# Patient Record
Sex: Female | Born: 1980 | Race: White | Hispanic: No | Marital: Married | Smoking: Former smoker
Health system: Southern US, Community
[De-identification: ages and names within clinical notes are randomized; demographics above are authoritative.]

## PROBLEM LIST (undated history)

## (undated) DIAGNOSIS — B999 Unspecified infectious disease: Secondary | ICD-10-CM

## (undated) DIAGNOSIS — R87629 Unspecified abnormal cytological findings in specimens from vagina: Secondary | ICD-10-CM

## (undated) DIAGNOSIS — Z1612 Extended spectrum beta lactamase (ESBL) resistance: Principal | ICD-10-CM

## (undated) DIAGNOSIS — B9629 Other Escherichia coli [E. coli] as the cause of diseases classified elsewhere: Secondary | ICD-10-CM

## (undated) DIAGNOSIS — N390 Urinary tract infection, site not specified: Principal | ICD-10-CM

## (undated) HISTORY — DX: Other Escherichia coli (E. coli) as the cause of diseases classified elsewhere: B96.29

## (undated) HISTORY — DX: Urinary tract infection, site not specified: N39.0

## (undated) HISTORY — DX: Extended spectrum beta lactamase (ESBL) resistance: Z16.12

---

## 2013-09-18 ENCOUNTER — Ambulatory Visit: Payer: Self-pay

## 2014-02-12 ENCOUNTER — Ambulatory Visit: Payer: Self-pay | Admitting: Family Medicine

## 2015-09-16 ENCOUNTER — Other Ambulatory Visit: Payer: Self-pay | Admitting: Obstetrics and Gynecology

## 2015-09-16 DIAGNOSIS — Z1231 Encounter for screening mammogram for malignant neoplasm of breast: Secondary | ICD-10-CM

## 2015-09-30 ENCOUNTER — Ambulatory Visit
Admission: RE | Admit: 2015-09-30 | Discharge: 2015-09-30 | Disposition: A | Discharge status: Home / Self Care | Payer: BLUE CROSS/BLUE SHIELD | Source: Ambulatory Visit | Attending: Obstetrics and Gynecology | Admitting: Obstetrics and Gynecology

## 2015-09-30 ENCOUNTER — Other Ambulatory Visit: Payer: Self-pay | Admitting: Obstetrics and Gynecology

## 2015-09-30 DIAGNOSIS — Z1231 Encounter for screening mammogram for malignant neoplasm of breast: Secondary | ICD-10-CM | POA: Diagnosis not present

## 2017-12-07 ENCOUNTER — Ambulatory Visit: Payer: BLUE CROSS/BLUE SHIELD | Admitting: Physical Therapy

## 2017-12-27 ENCOUNTER — Encounter: Payer: Self-pay | Admitting: Physical Therapy

## 2017-12-27 ENCOUNTER — Other Ambulatory Visit: Payer: Self-pay

## 2017-12-27 ENCOUNTER — Ambulatory Visit: Payer: BLUE CROSS/BLUE SHIELD | Attending: Obstetrics and Gynecology | Admitting: Physical Therapy

## 2017-12-27 DIAGNOSIS — R278 Other lack of coordination: Secondary | ICD-10-CM | POA: Diagnosis not present

## 2017-12-27 DIAGNOSIS — M62838 Other muscle spasm: Secondary | ICD-10-CM | POA: Insufficient documentation

## 2017-12-27 DIAGNOSIS — R293 Abnormal posture: Secondary | ICD-10-CM | POA: Diagnosis present

## 2017-12-27 NOTE — Patient Instructions (Addendum)
Increase water with one room temp glass in the morning before soda   Complete 20 fl oz of water per day and then progress to 2 servings .    ____  Out of office chair every hour with stretches  Prop R foot on stable chair, rocking back and forth,  L arm on R thigh ( gentle twist)  R arm on R thigh and  shoulder rolls     6 directions of the spine :   sidebend by the wall  arm swings without moving hips  minisquat , with arms back , interlace hands, pull chest lifts up  ( bare foot)   Minisquat: Scoot buttocks back slight, hinge like you are looking at your reflection on a pond  Knees behind toes,  Inhale to "smell flowers"  Exhale on the rise "like rocket"  Do not lock knees, have more weight across ballmounds of feet, toes relaxed   __  Incorporate 15 min walk 2 x worksday   Or 2 min standing marching ( improve blood flow to the legs)   ___  ( handout of office ergonomic ( to minimize rounded shoulders for better circulation in the arms)

## 2017-12-28 NOTE — Therapy (Addendum)
[place] [facility] MAIN [facility] SERVICES [address] [place], [place], [address] Phone: [phone]   Fax:  [phone]  Physical Therapy Evaluation  Patient Details  Name: Mariah [name] MRN: [id number] Date of Birth: 1980/07/25 Referring Provider (PT): Christeen Douglas    Encounter Date: 12/27/2017    Past Medical History:  Diagnosis Date  . Chronic headaches     No past surgical history on file.  There were no vitals filed for this visit.   Subjective Assessment - 12/28/17 2359    Subjective 1) Pelvic pain: Pt has had pelvic pain during pelvic exams for many years with the spectulum and also since 4 months ago when she had sexual intercourse. Pt has not had sex since she was 20 which was painful. Some spotting but no major afterwards. 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 Pt used to go the gym 2x week for 1-2 years and did circuits which included some weight lifting, machines, elliptical, treadmill, core exercises, sit-ups, planks, push ups.  Pt is not exercising now due to busy schedule.    2)  constant LBP across the past 10 years  ( pt points at sacrum). It has flared up and calmed over the years. 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 Pt went to 1-2 PT sessions because she found chiropractic treatments to resolve her pain temporarily.  At worst, 10 /10 with radiating pain behind knee most of the time on RLE, sometimes LLE.  At its best, 1/10 pain.  Currently, pain 3/10 with radiating.   3) Recurrent UTIs in November 2018, August 2018 and a yeast infection last week. Pt wipes front to back, pee and wash after intercourse.  Daily water intake " no much",  12 fl oz of Mountain Dew and 20 oz water.          4) Tingling in both feet (soles)  and arms and hands ( palm) upon waking which occurs randomly since beginning of this year.       Patient Stated Goals  decrease pain and fertility         [facility] PT Assessment - 01/04/18 1640      Assessment    Medical Diagnosis  Pelvic Pain    Referring Provider (PT)  Christeen Douglas       Precautions   Precautions  None      Restrictions   Weight Bearing Restrictions  No      Balance Screen   Has the patient fallen in the past 6 months  No      Observation/Other Assessments   Observations  ankles crossed      Coordination   Gross Motor Movements are Fluid and Coordinated  --   abdominal overuse w/pelvic floor cue( lengthening/contracti     Posture/Postural Control   Posture Comments  limited diaphragmatic excursion       Strength   Overall Strength Comments  BLE gross: 3+/4 B       Palpation   Spinal mobility  hypomobility of thoracic region                Objective measurements completed on examination: See above findings.    Pelvic Floor Special Questions - 01/04/18 1640    Diastasis Recti  neg     External Perineal Exam  no tenderness nor tightness at pelvic floor mm          Treatment:   See pt instructions  PT Long Term Goals - 01/04/18 1628      PT LONG TERM GOAL #1   Title  Pt will demo no tenderness/ tensions of pelvic floor and demo proepr ROM in order to minimize pelvic pain to resume ADLs    Time  6    Period  Weeks    Status  New    Target Date  02/15/18      PT LONG TERM GOAL #2   Title  Pt will demo increased spinal/pelvic mobility and IND with work stretches and compliance to ergonomic workstation recommendations to promote proper circulation to pelvic area and counteract negative health effects of sedentary work week      Time  4    Period  Weeks    Status  New    Target Date  02/01/18      PT LONG TERM GOAL #3   Title  Pt will demo proper sitting posture without cues in order to minimize straining pelvic floor     Time  2    Period  Weeks    Status  New    Target Date  01/18/18      PT LONG TERM GOAL #4   Title  Pt will report decreased numbness/tingling in soles and palms upon waking across 5 days     Time  4     Period  Weeks    Status  New    Target Date  02/01/18      PT LONG TERM GOAL #5   Title  Pt will decrease her back pain by 50% across 1 week in order to perform weighted activities     Time  10    Period  Weeks    Status  New    Target Date  03/15/18             Plan - 12/28/17 2355    Clinical Impression Statement  Pt is a 37 yo who reports pelvic pain, CLBP, and recurrent UTIs, and tingling in both soles of feet, arms and palm upon waking which impact her QOL and ADLs. These deficits impact her QOL and ADLs. Pt's presentation includes limited spinal/pelvic mobility, hip weakness, dyscoordination of pelvic floor/ deep core, poor posture. After today's session, pt demo'd IND with HEP to promote spinal mobility and proper posture, promote more circulation during sedentary work duties.  Provided education on increasing water intake gradually to minimize recurrent UTI. Plan to further assess pelvic floor at next session.      Clinical Presentation  Evolving    Clinical Decision Making  Moderate    Rehab Potential  Good    PT Frequency  1x / week    PT Duration  12 weeks    PT Treatment/Interventions  Therapeutic activities;Functional mobility training;Neuromuscular re-education;Therapeutic exercise;Gait training;Moist Heat;Stair training;Patient/family education;Manual techniques;Scar mobilization;Electrical Stimulation;Manual lymph drainage;Aquatic Therapy    Consulted and Agree with Plan of Care  Patient       Patient will benefit from skilled therapeutic intervention in order to improve the following deficits and impairments:  Hypomobility, Improper body mechanics, Decreased strength, Decreased safety awareness, Increased muscle spasms, Decreased mobility, Decreased endurance, Decreased balance, Decreased range of motion, Decreased coordination, Postural dysfunction, Pain  Visit Diagnosis: Other lack of coordination  Abnormal posture  Other muscle spasm     Problem  List There are no active problems to display for this patient.   Mariane Masters ,PT, DPT, E-RYT  01/04/2018, 4:41 PM  [facility]  [facility] MA[facility] SERVICES [address] [place], [place], [address] Phone: [phone]   Fax:  [phone]  Name: CROSBY BEVAN MRN: [id number] Date of Birth: [birth year]

## 2018-01-04 ENCOUNTER — Ambulatory Visit: Payer: BLUE CROSS/BLUE SHIELD | Admitting: Physical Therapy

## 2018-01-04 DIAGNOSIS — R278 Other lack of coordination: Secondary | ICD-10-CM | POA: Diagnosis not present

## 2018-01-04 NOTE — Addendum Note (Signed)
Addended by: Mariane MastersYEUNG, SHIN-YIING on: 01/04/2018 04:51 PM   Modules accepted: Orders

## 2018-01-04 NOTE — Patient Instructions (Addendum)
Pelvic floor stretches: morning and night / after work     1)  Happy Baby pose  5 breaths       2) Stretch for pelvic floor   "v heels slide away and then back toward buttocks and then rock knee to slight ,  slide heel along at 11 o clock away from buttocks   10 reps         3)  Gentle pelvic tilts   ______ Principle of points of contact    1) when sitting ( feet on ground, hip width apart), sitting bones on seat,    Functional activities: 2) All fours ( table top position) position with toes tucked   3) Pillow under hips when laying on ground    _______ Relaxation practice upon returning home from work  Legs propped on wall / over pillows, knees apart or parallel  10 min  Breathing practice to wind down  Body scan ( emailed)

## 2018-01-04 NOTE — Therapy (Signed)
[place] [facility] MAIN [facility] SERVICES [address] [place], [place], [address] Phone: [phone]   Fax:  [phone]  Physical Therapy Treatment  Patient Details  Name: Mariah [name] MRN: [id number] Date of Birth: 09-29-1980 Referring Provider (PT): Christeen Douglas    Encounter Date: 01/04/2018  PT End of Session - 01/04/18 1609    Visit Number  2    Number of Visits  12    PT Start Time  0805    PT Stop Time  0900    PT Time Calculation (min)  55 min    Activity Tolerance  Patient tolerated treatment well    Behavior During Therapy  [facility] for tasks assessed/performed       Past Medical History:  Diagnosis Date  . Chronic headaches     No past surgical history on file.  There were no vitals filed for this visit.  Subjective Assessment - 01/04/18 0818    Subjective  Pt reported she stretched at work and went to a yoga class. Pt started drinking water. Pt reports her urine smells normal but she notices burning with urination in the top pelvic floor muscles.          [facility] PT Assessment - 01/04/18 1609      Observation/Other Assessments   Observations  ankles crossed       Palpation   Spinal mobility  increased thoracic mobility                 Pelvic Floor Special Questions - 01/04/18 0921    External Palpation  increased tightenss along bulbospongious/ ischiocavernosus      Pelvic Floor Internal Exam  pt consented verbally without contraindications    Exam Type  Vaginal    Palpation  increased tenderness/ tensions on L urethra compressae, bulbospongious, , B coccyegus, iliococcygeus, obt int  L > R          [facility] Adult PT Treatment/Exercise - 01/04/18 1602      Neuro Re-ed    Neuro Re-ed Details   see pt instructions, biopsychosocial education to pelvic pain, applications to stretches to retrain brain on pain      Manual Therapy   Manual therapy comments  superior glides over mons pubis, bulbospongiosus /  ischiocavernosus B     Internal Pelvic Floor  STM/MWM coordinated breathing at problem areas noted assessment                          Plan - 01/04/18 1610    Clinical Impression Statement  Pt showed increased pelvic and spinal mobility with compliance to stretches. Pt showed increased pelvic floor tightness which decreased with internal/ external manual Tx with report of decreased tenderness. Pt was provided biopyschosocial approaches, neuroscience of pain. Pt is limited to low number of visits due to insurance and finances. Plan to address yoga modifications to optimize pelvic floor lengthening and lower kinetic chain co-activation. Pt continues to benefit from skilled PT.      Rehab Potential  Good    PT Frequency  1x / week    PT Duration  12 weeks    PT Treatment/Interventions  Therapeutic activities;Functional mobility training;Neuromuscular re-education;Therapeutic exercise;Gait training;Moist Heat;Stair training;Patient/family education;Manual techniques;Scar mobilization;Electrical Stimulation;Manual lymph drainage;Aquatic Therapy    Consulted and Agree with Plan of Care  Patient       Patient will benefit from skilled therapeutic intervention in order to improve the following deficits and impairments:  Hypomobility, Improper body mechanics, Decreased strength, Decreased safety awareness, Increased muscle spasms, Decreased mobility, Decreased endurance, Decreased balance, Decreased range of motion, Decreased coordination, Postural dysfunction, Pain  Visit Diagnosis: Other lack of coordination     Problem List There are no active problems to display for this patient.   Mariane MastersYeung,Shin Yiing ,PT, DPT, E-RYT  01/04/2018, 4:25 PM  [facility] [address] [place], [place], [address] Phone: [phone]   Fax:  [phone]  Name: Albertina ParrHeather K Bhullar MRN: [id number] Date of Birth: 21-Sep-1980

## 2018-01-11 ENCOUNTER — Encounter: Payer: BLUE CROSS/BLUE SHIELD | Admitting: Physical Therapy

## 2018-01-18 ENCOUNTER — Ambulatory Visit: Payer: BLUE CROSS/BLUE SHIELD | Admitting: Physical Therapy

## 2018-01-18 DIAGNOSIS — M62838 Other muscle spasm: Secondary | ICD-10-CM

## 2018-01-18 DIAGNOSIS — R293 Abnormal posture: Secondary | ICD-10-CM

## 2018-01-18 DIAGNOSIS — R278 Other lack of coordination: Secondary | ICD-10-CM | POA: Diagnosis not present

## 2018-01-18 NOTE — Patient Instructions (Addendum)
Start sleeping on back , pillow low with chin neutral, shoulder blades flat, palm open up   If roll on side, keep pillow between knees and make sure your shoulders are not rounded  And make sure your head is aligned with sacrum ( Avoid fetal position ) to make sure your have blood flow through your brachial [plexus to minimize numbness in your hands upon waking   _____   [employer] 45 Degrees this week on L only ( R sidelying)  10 reps on wrist slightly ahead of shoulder, shoulders blades down and back  10 reps on 45 deg placement of forearm, shoulders blades down and back    .  Complimentary stretch: Cross _ foot over _ thigh, opposite knee straight  3 breaths   Cross thigh over thigh, exhale to hug the thighs in with arms pulling back of thigh, shoulders/ head is relaxed down , Use towel behind thigh is needed.  10 reps  _____  Neck stretches at night/ morning on bed: 1) 6 directions of neck with shoulder blades down and back 5 reps each 2)Angel wings up and bat wings down 10 reps 3)  Forearm, wrists, and finger stretches:  Elbows straight,thumbs down, cross wrists over pull away to feel the middle of shoulder blades stretch ( wings pulling apart from each other) Then squeeze shoulder blades together and down, Bring knuckles towards your chest and end with them under chin Reverse 5 reps And switch to the other side  4) shoulder squeezes down and back with chin tuck  Count aloud 5 sec x 1 0 reps to retrain where the neck needs to be ( in line with sacrum)     Neck stretches at work: midmorning/ mid afternoon 1) Backward elbow circles 10 reps  2) Angel wings up and bat wings down 10 reps 3) Forearm, wrists, and finger stretches:  Elbows straight,thumbs down, cross wrists over pull away to feel the middle of shoulder blades stretch ( wings pulling apart from each other) Then squeeze shoulder blades together and down, Bring knuckles towards your chest and end with them under  chin  Reverse 5 reps And switch to the other side  4) shoulder squeezes down and back with chin tuck  Count aloud 5 sec x 1 0 reps to retrain where the neck needs to be ( in line with sacrum)    ______  bodyscan ( emailed audio) for mindfulness and pain science therapeutic    _____   Avoid straining pelvic floor, abdominal muscles , spine  Use log rolling technique instead of getting out of bed with your neck or the sit-up   Log rolling into and out of .bed  With sidelying position first with shoulder stacked

## 2018-01-18 NOTE — Therapy (Signed)
[facility] [address] [place], [place], [address] Phone: [phone]   Fax:  [phone]  Physical Therapy Treatment  Patient Details  Name: Mariah [name] MRN: [id number] Date of Birth: 1981/02/19 Referring Provider (PT): Christeen DouglasBethany Beasley    Encounter Date: 01/18/2018  PT End of Session - 01/18/18 0854    Visit Number  3    Number of Visits  12    PT Start Time  0800    PT Stop Time  0859    PT Time Calculation (min)  59 min    Activity Tolerance  Patient tolerated treatment well    Behavior During Therapy  [facility] for tasks assessed/performed       Past Medical History:  Diagnosis Date  . Chronic headaches     No past surgical history on file.  There were no vitals filed for this visit.  Subjective Assessment - 01/18/18 0810    Subjective  Pt reports she no longer has burning with urination. Pt also has not had the LBP she had went she started PT but yesterday, pt woke with L hip pain. Pt thinks she twisted wrong. Currently it hurt in the L glut with trunk rotation B ( L > R) and sidebend. Pt does not sleep with a pillow between her knees          [facility] PT Assessment - 01/18/18 0853      AROM   Overall AROM Comments  limited trunk rotation and sidebend ~20% B       Strength   Overall Strength Comments  hip abd L 3/5       Palpation   SI assessment   L PSIS more posterior,  hip ext standing with lumbar compensation  ( improved post Tx)     Palpation comment  upper trap/levator/midtrap tightness B ( decreased post Tx)                    [facility] Adult PT Treatment/Exercise - 01/18/18 0825      Neuro Re-ed    Neuro Re-ed Details   see pt instructions, biopsychosocial education to pelvic pain, applications to stretches to retrain brain on pain      Manual Therapy   Manual therapy comments  long axis distraction, rotation mob to mobilize L SIJ . distraction at upper cervical, inferior mob at  scap spine with sidebend B                   PT Long Term Goals - 01/04/18 1628      PT LONG TERM GOAL #1   Title  Pt will demo no tenderness/ tensions of pelvic floor and demo proepr ROM in order to minimize pelvic pain to resume ADLs    Time  6    Period  Weeks    Status  New    Target Date  02/15/18      PT LONG TERM GOAL #2   Title  Pt will demo increased spinal/pelvic mobility and IND with work stretches and compliance to ergonomic workstation recommendations to promote proper circulation to pelvic area and counteract negative health effects of sedentary work week      Time  4    Period  Weeks    Status  New    Target Date  02/01/18      PT LONG TERM GOAL #3   Title  Pt will demo proper sitting posture without cues  in order to minimize straining pelvic floor     Time  2    Period  Weeks    Status  New    Target Date  01/18/18      PT LONG TERM GOAL #4   Title  Pt will report decreased numbness/tingling in soles and palms upon waking across 5 days     Time  4    Period  Weeks    Status  New    Target Date  02/01/18      PT LONG TERM GOAL #5   Title  Pt will decrease her back pain by 50% across 1 week in order to perform weighted activities     Time  10    Period  Weeks    Status  New    Target Date  03/15/18            Plan - 01/18/18 0908    Clinical Impression Statement  Pt is making progress with report of no more burning with urination and no more LBP.  Today, addressed L hip pain which started upon waking yesterday but through asssessment, this acute pain is likely related to her preferred sleeping position in a fetal position. Pt also reports numbness in her hands upon waking which is likely due to the compression of her brachial plexus from the fetal position.  Educated on sleeping on back and if on the side (place pillow between knees).  Also educated and trained propiocetion of neck and shoulders. Manual Tx decreased pelvic obliquities and  shoulder / neck tightness. Initiated scapular stabilization strengthening for improved upright posture. Pt continues to benefit from skilled PT. and plan to address pelvic floor further at next session.     Rehab Potential  Good    PT Frequency  1x / week    PT Duration  12 weeks    PT Treatment/Interventions  Therapeutic activities;Functional mobility training;Neuromuscular re-education;Therapeutic exercise;Gait training;Moist Heat;Stair training;Patient/family education;Manual techniques;Scar mobilization;Electrical Stimulation;Manual lymph drainage;Aquatic Therapy    Consulted and Agree with Plan of Care  Patient       Patient will benefit from skilled therapeutic intervention in order to improve the following deficits and impairments:  Hypomobility, Improper body mechanics, Decreased strength, Decreased safety awareness, Increased muscle spasms, Decreased mobility, Decreased endurance, Decreased balance, Decreased range of motion, Decreased coordination, Postural dysfunction, Pain  Visit Diagnosis: Other lack of coordination  Abnormal posture  Other muscle spasm     Problem List There are no active problems to display for this patient.   Mariane Masters ,PT, DPT, E-RYT  01/18/2018, 9:43 AM  [place] [facility] MAIN [facility] SERVICES [address] [place], [place], [address] Phone: [phone]   Fax:  [phone]  Name: Mariah [name] MRN: [id number] Date of Birth: March 02, 1980

## 2018-01-25 ENCOUNTER — Encounter: Payer: BLUE CROSS/BLUE SHIELD | Admitting: Physical Therapy

## 2018-02-01 ENCOUNTER — Ambulatory Visit: Payer: BLUE CROSS/BLUE SHIELD | Attending: Obstetrics and Gynecology | Admitting: Physical Therapy

## 2018-02-01 DIAGNOSIS — R278 Other lack of coordination: Secondary | ICD-10-CM | POA: Insufficient documentation

## 2018-02-01 DIAGNOSIS — R293 Abnormal posture: Secondary | ICD-10-CM | POA: Diagnosis present

## 2018-02-01 DIAGNOSIS — M62838 Other muscle spasm: Secondary | ICD-10-CM | POA: Diagnosis present

## 2018-02-01 NOTE — Therapy (Signed)
[place] [facility] MAIN [facility] SERVICES [address] [place], [place], [address] Phone: [phone]   Fax:  [phone]  Physical Therapy Treatment  Patient Details  Name: Mariah [name] MRN: [id number] Date of Birth: [birth year] Referring Provider (PT): Christeen Douglas    Encounter Date: 02/01/2018  PT End of Session - 02/01/18 0906    Visit Number  4    Number of Visits  12    PT Start Time  0810    PT Stop Time  0902    PT Time Calculation (min)  52 min    Activity Tolerance  Patient tolerated treatment well    Behavior During Therapy  [facility] for tasks assessed/performed       Past Medical History:  Diagnosis Date  . Chronic headaches     No past surgical history on file.  There were no vitals filed for this visit.  Subjective Assessment - 02/01/18 0818    Subjective  The L glut pain got better. Low back hurt when she sneezes. Pt still experiences burning with sexual intercourse.  Pt states she is [redacted] weeks pregnant          [facility] PT Assessment - 02/01/18 0835      Observation/Other Assessments   Observations  knee aligned with hips, L toes turned in ( neuro-reedu applied during internal pelvic floor, pnoted increased tightness at L anterior triangle of pelvic floor with toes turned in, no tensions/tightness with proper toe alignment)                 Pelvic Floor Special Questions - 02/01/18 0820    Pelvic Floor Internal Exam  pt consented verbally without contraindications    Exam Type  Vaginal    Palpation  increased tenderness/ tensionsL at proximal attachment of bulbospongiosus, ischiocavernosus L         [facility] Adult PT Treatment/Exercise - 02/01/18 0901      Therapeutic Activites    Therapeutic Activities  --   education about avoiding twists/ strenuous activity w. pregn     Neuro Re-ed    Neuro Re-ed Details   see pt instructions, biopsychosocial education to pelvic pain, applications to stretches to retrain  brain on pain      Manual Therapy   Manual therapy comments  AP mob at midfoot joints to promote dorsiflexion B    external: STM/MWM at anterior pelvic floor mm noted in asses                 PT Long Term Goals - 01/04/18 1628      PT LONG TERM GOAL #1   Title  Pt will demo no tenderness/ tensions of pelvic floor and demo proepr ROM in order to minimize pelvic pain to resume ADLs    Time  6    Period  Weeks    Status  New    Target Date  02/15/18      PT LONG TERM GOAL #2   Title  Pt will demo increased spinal/pelvic mobility and IND with work stretches and compliance to ergonomic workstation recommendations to promote proper circulation to pelvic area and counteract negative health effects of sedentary work week      Time  4    Period  Weeks    Status  New    Target Date  02/01/18      PT LONG TERM GOAL #3   Title  Pt will demo proper sitting posture without cues in  order to minimize straining pelvic floor     Time  2    Period  Weeks    Status  New    Target Date  01/18/18      PT LONG TERM GOAL #4   Title  Pt will report decreased numbness/tingling in soles and palms upon waking across 5 days     Time  4    Period  Weeks    Status  New    Target Date  02/01/18      PT LONG TERM GOAL #5   Title  Pt will decrease her back pain by 50% across 1 week in order to perform weighted activities     Time  10    Period  Weeks    Status  New    Target Date  03/15/18            Plan - 02/01/18 0906    Clinical Impression Statement  Pt showed signficantly less pelvic floor tightness. Residual tightness was addressed with manual Tx and neuro-muscular re-education with lower kinetic chain and stability principles. Addressed pt's high arches and limited DF AROM with manual Tx to promote more co-activation of arches in upright posture.  Addressed retraining of minimizing adduction of L foot to minimize overactivity of L pelvic floor mm. Pt continues to benefit from  skilled PT.     Rehab Potential  Good    PT Frequency  1x / week    PT Duration  12 weeks    PT Treatment/Interventions  Therapeutic activities;Functional mobility training;Neuromuscular re-education;Therapeutic exercise;Gait training;Moist Heat;Stair training;Patient/family education;Manual techniques;Scar mobilization;Electrical Stimulation;Manual lymph drainage;Aquatic Therapy    Consulted and Agree with Plan of Care  Patient       Patient will benefit from skilled therapeutic intervention in order to improve the following deficits and impairments:  Hypomobility, Improper body mechanics, Decreased strength, Decreased safety awareness, Increased muscle spasms, Decreased mobility, Decreased endurance, Decreased balance, Decreased range of motion, Decreased coordination, Postural dysfunction, Pain  Visit Diagnosis: Other lack of coordination  Abnormal posture  Other muscle spasm     Problem List There are no active problems to display for this patient.   Mariane MastersYeung,Shin Yiing ,PT, DPT, E-RYT  02/01/2018, 9:09 AM  [facility] MAIN [facility] [address] [place], [place], [address] Phone: [phone]   Fax:  [phone]  Name: Mariah [name] MRN: [id number] Date of Birth: 1980/06/13

## 2018-02-01 NOTE — Patient Instructions (Addendum)
Focus on 4 corners of feet during deep core level 2   Also with work stretch and exercise  ____ Seated: Notice when toes turn in, keep straight to relax L pelvic floor Notice points of contact into sitting bones and feet, to relax pelvic floor   Foot glides back with heel down for more dorsiflexion  ____ Standing, more center of mass at the ballmounds line, soft knees, arch lifts naturally   ____ Morning and night,  ankle pumps, spread toes,   _____  Pelvic squeeze with coughing and sneezing, inhale to relax again

## 2018-02-08 ENCOUNTER — Ambulatory Visit: Payer: BLUE CROSS/BLUE SHIELD | Admitting: Physical Therapy

## 2018-03-10 ENCOUNTER — Ambulatory Visit: Payer: BLUE CROSS/BLUE SHIELD | Admitting: Physical Therapy

## 2018-03-13 ENCOUNTER — Other Ambulatory Visit: Payer: Self-pay | Admitting: Obstetrics and Gynecology

## 2018-03-13 DIAGNOSIS — Z369 Encounter for antenatal screening, unspecified: Secondary | ICD-10-CM

## 2018-03-23 ENCOUNTER — Ambulatory Visit: Payer: BLUE CROSS/BLUE SHIELD | Attending: Infectious Diseases | Admitting: Infectious Diseases

## 2018-03-23 ENCOUNTER — Encounter: Payer: Self-pay | Admitting: Infectious Diseases

## 2018-03-23 ENCOUNTER — Ambulatory Visit
Admission: RE | Admit: 2018-03-23 | Discharge: 2018-03-23 | Disposition: A | Discharge status: Home / Self Care | Payer: BLUE CROSS/BLUE SHIELD | Source: Ambulatory Visit | Attending: Maternal & Fetal Medicine | Admitting: Maternal & Fetal Medicine

## 2018-03-23 ENCOUNTER — Ambulatory Visit (HOSPITAL_BASED_OUTPATIENT_CLINIC_OR_DEPARTMENT_OTHER)
Admission: RE | Admit: 2018-03-23 | Discharge: 2018-03-23 | Disposition: A | Discharge status: Home / Self Care | Payer: BLUE CROSS/BLUE SHIELD | Source: Ambulatory Visit | Attending: Maternal & Fetal Medicine | Admitting: Maternal & Fetal Medicine

## 2018-03-23 ENCOUNTER — Other Ambulatory Visit: Payer: Self-pay | Admitting: Infectious Diseases

## 2018-03-23 ENCOUNTER — Telehealth: Payer: Self-pay | Admitting: Infectious Diseases

## 2018-03-23 VITALS — BP 126/80 | HR 75 | Temp 97.7°F | Wt 175.0 lb

## 2018-03-23 VITALS — BP 111/75 | HR 107 | Temp 97.6°F | Ht 70.0 in | Wt 175.0 lb

## 2018-03-23 DIAGNOSIS — B9629 Other Escherichia coli [E. coli] as the cause of diseases classified elsewhere: Secondary | ICD-10-CM

## 2018-03-23 DIAGNOSIS — Z3682 Encounter for antenatal screening for nuchal translucency: Secondary | ICD-10-CM | POA: Diagnosis present

## 2018-03-23 DIAGNOSIS — Z1624 Resistance to multiple antibiotics: Secondary | ICD-10-CM

## 2018-03-23 DIAGNOSIS — N390 Urinary tract infection, site not specified: Secondary | ICD-10-CM

## 2018-03-23 DIAGNOSIS — O2341 Unspecified infection of urinary tract in pregnancy, first trimester: Secondary | ICD-10-CM

## 2018-03-23 DIAGNOSIS — Z87891 Personal history of nicotine dependence: Secondary | ICD-10-CM

## 2018-03-23 DIAGNOSIS — Z3A12 12 weeks gestation of pregnancy: Secondary | ICD-10-CM

## 2018-03-23 DIAGNOSIS — N941 Unspecified dyspareunia: Secondary | ICD-10-CM

## 2018-03-23 DIAGNOSIS — N3090 Cystitis, unspecified without hematuria: Secondary | ICD-10-CM

## 2018-03-23 DIAGNOSIS — O2311 Infections of bladder in pregnancy, first trimester: Secondary | ICD-10-CM | POA: Diagnosis not present

## 2018-03-23 DIAGNOSIS — Z87440 Personal history of urinary (tract) infections: Secondary | ICD-10-CM

## 2018-03-23 DIAGNOSIS — Z369 Encounter for antenatal screening, unspecified: Secondary | ICD-10-CM

## 2018-03-23 DIAGNOSIS — B9620 Unspecified Escherichia coli [E. coli] as the cause of diseases classified elsewhere: Secondary | ICD-10-CM | POA: Diagnosis not present

## 2018-03-23 DIAGNOSIS — O09511 Supervision of elderly primigravida, first trimester: Secondary | ICD-10-CM

## 2018-03-23 DIAGNOSIS — Z1612 Extended spectrum beta lactamase (ESBL) resistance: Principal | ICD-10-CM

## 2018-03-23 HISTORY — DX: Other Escherichia coli (E. coli) as the cause of diseases classified elsewhere: B96.29

## 2018-03-23 HISTORY — DX: Urinary tract infection, site not specified: N39.0

## 2018-03-23 HISTORY — DX: Unspecified infectious disease: B99.9

## 2018-03-23 HISTORY — DX: Unspecified abnormal cytological findings in specimens from vagina: R87.629

## 2018-03-23 NOTE — Progress Notes (Signed)
NAME: Mariah [name]  DOB: 02-21-1981  MRN: [id number]  Date/Time: 03/23/2018 9:24 AM  Dalbert Garnet Subjective:  REASON FOR CONSULT: MDR e.coli cystitis ? Mariah [name] is a 38 y.o. female who is [redacted] weeks pregnant is here for UTI with a MDR organism. As per patient  She got married in June 2019  and has been having frequent sex . Prior to that she says was not sexually active for many years. . She says she is very tight and it hurts during and after sex . She is undergoing physical therapy for dyspaurenia and vaginal tightness.  She has been treated for Klebsiella UTi  in Aug 2019 and it was a sensitive bacteria and she was given cipro and her symptoms of burning, frequency resolved . She is now [redacted] weeks pregnant. She had symptoms of frequency, urgency and dysuria 2 days after sex in Jan 2020   and Urine culture (03/01/18) showed  ESBL e.coli. Her obgyn Dr.Beasley spoke to [facility] and was recommended  fosfomycin. Her symptoms resolved and her follow up urine culture was negative on 03/09/18.  Dysuria and frequency recurred  on 03/15/18. Pt was sexually active a day or two before that. Urine culture from 1/23 is again ESBL e.coli and I asked to see her. She says her urine smells, some sacral pain, urgency and frequency. No fever or hematuria Past Medical History:  Diagnosis Date  . Chronic headaches    LGSIL- HPV positive No past surgical history on file.   SH Former smoker Works as an Airline pilot  FH Mother deceased-ALS ? Current Outpatient Medications  Medication Sig Dispense Refill  . cetirizine (ZYRTEC) 10 MG tablet Take 10 mg by mouth daily.    . fluticasone (VERAMYST) 27.5 MCG/SPRAY nasal spray Place 2 sprays into the nose daily.    . montelukast (SINGULAIR) 10 MG tablet Take 10 mg by mouth at bedtime.    . pyridoxine (B-6) 100 MG tablet Take 100 mg by mouth daily.     No current facility-administered medications for this visit.      Abtx:  Anti-infectives (From admission, onward)    None      REVIEW OF SYSTEMS:  Const: negative fever, negative chills, negative weight loss Eyes: negative diplopia or visual changes, negative eye pain ENT: negative coryza, negative sore throat Resp: negative cough, hemoptysis, dyspnea Cards: negative for chest pain, palpitations, lower extremity edema GU: positive for frequency, dysuria ,no  Hematuria, low back pain-near sacrum  GI: Negative for abdominal pain, diarrhea, bleeding, constipation Skin: negative for rash and pruritus Heme: negative for easy bruising and gum/nose bleeding MS: negative for myalgias, arthralgias, back pain and muscle weakness Neurolo:negative for headaches, dizziness, vertigo, memory problems  Psych: negative for feelings of anxiety, depression  Endocrine: negative for thyroid, diabetes Allergy/Immunology- negative for any medication or food allergies ? Pertinent Positives include : Objective:  VITALS:  BP 111/75 (BP Location: Left Arm, Patient Position: Sitting, Cuff Size: Normal)   Pulse (!) 107   Temp 97.6 F (36.4 C) (Oral)   Ht 5\' 10"  (1.778 m)   Wt 175 lb (79.4 kg)   BMI 25.11 kg/m  PHYSICAL EXAM:  General: Alert, cooperative, no distress, appears stated age.  Head: Normocephalic, without obvious abnormality, atraumatic. Eyes: Conjunctivae clear, anicteric sclerae. Pupils are equal ENT Nares normal. No drainage or sinus tenderness. Lips, mucosa, and tongue normal. No Thrush Neck: Supple,  Back: No CVA tenderness. Lungs: Clear to auscultation bilaterally. No Wheezing or Rhonchi. No rales.  Heart: Regular rate and rhythm, no murmur, rub or gallop. Abdomen: Soft, non-tender,not distended. Bowel sounds normal. No masses Extremities: atraumatic, no cyanosis. No edema. No clubbing Skin: limited examination-No rashes or lesions. Or bruising Lymph: Cervical, supraclavicular normal. Neurologic: Grossly non-focal Pertinent Labs Lab Results CBC Labs from [facility] reviewed 03/09/18 Chl/GC  neg RPR NR WBC 9.5 Hb 12.6 PLT 227 HIV nR  Microbiology: 09/21/17 Klebsiella  R only to amp and nitrofurantoin 03/01/2018- E.coli ESBL S to Pip/tazo and Imipenem 03/16/18 E.coli same susceptibility  IMAGING RESULTS:none ? Impression/Recommendation ?38 y.o. female who is [redacted] weeks pregnant is here for UTI with a MDR organism.  ESBl e.coli cystitis in patient who is [redacted] weeks pregnant . Treated before with fosfomycin 3 grams 1 dose on 1/10 and clearance of symptoms and microbiological cure   on 1/16 with recurrence on 1/23. Patient has dyspareunia and during /after she has burning and pain. She got married June 2019 and has had a few episodes since then and not before that.  She is behaving like honeymoon cystitis She is at risk for infection  because of friction during vaginal  intercourse and e.coli being a stool pathogen, could be colonizing genital area, and being exposed to cipro in the past she has developed resistance.   ESBL e.coli she has no oral options except fosfomycin which will likely still work as she had microbiological cure after the 1 st dose. Would try again 1 dose fosfomycin to see whether it will clear the infection or  do multidose(3) fosfomycin which has been shown to be more effective in a few non randomized studies ( Evaluation of three dose fosfomycin in the treatment of patients with UTI -BMJ open 2013).  Especially with ESBL e.coli 3 doses may work better . She does not have pyelonephritis Carabapenem Vs fosfomycin in the treatment of ESBL e.coli related complicated UTI (Jchemother 2010)   (no data of 3 dose  in pregnant women and also currently Fosfomycin is only FDA approved as a 1 dose )   Fosfomycin is category B in pregnancy   The other option is IV ertapenem which I would reserve if repeat  fosfomycin does not work or if she develops  pyelonephritis.  . It is important to address the intercourse  related  Vaginal  trauma and cystitis as she is prone to having  more UTI leading to more antibiotics and side effects and also more resistance.Other measures to prevent intercourse related cystitis should be employed including  Lubrication , personal hygiene, frequent micturition, plenty of hydration, probiotic, cranberry supplement etc She is already undergoing physical therapy for dyspareunia. .  Discussed with patient in detail and she is in favor of the oral option. Will discuss with her Ob/Gyn  Dr.Beasley before deciding on the final regimen- sent a  message to her. ? ___________________________________________________

## 2018-03-23 NOTE — Patient Instructions (Addendum)
You have been referred to me for ESBL e.coli UTI- you took fosfomycin 1 dose and had some improvement only to recur. You are pregnant and need to be treated- the question is whether e can do fosfomycin 3 doses or you need IV antibiotic  mostly your problem stems from frictional sex and so called honey moon cystitis- You may ask you perinatologist about the following Using dilators, cranberry supplement, cetaphil wash,probiotic -you are already having physical therapy for dyspareunia I will discuss with your perinatologist once you see her and decide the best antibiotic course

## 2018-03-23 NOTE — Progress Notes (Signed)
Referring Provider:  Christeen DouglasBeasley, Bethany Length of Consultation: 40 minutes  Ms. Mariah [name] was [facility] for genetic counseling because of advanced maternal age.  The patient will be 38 years old at the time of delivery.  This note summarizes the information we discussed.    We explained that the chance of a chromosome abnormality increases with maternal age.  Chromosomes and examples of chromosome problems were reviewed.  Humans typically have 46 chromosomes in each cell, with half passed through each sperm and egg.  Any change in the number or structure of chromosomes can increase the risk of problems in the physical and mental development of a pregnancy.   Based upon age of the patient and the current gestational age, the chance of any chromosome abnormality was 1 in 6629. The chance of Down syndrome, the most common chromosome problem associated with maternal age, was 1 in 2656.  The risk of chromosome problems is in addition to the 3% general population risk for birth defects and mental retardation.  The greatest chance, of course, is that the baby would be born in good health.  We discussed the following prenatal screening and testing options for this pregnancy:  First trimester screening, which includes nuchal translucency ultrasound screen and first trimester maternal serum marker screening.  The nuchal translucency has approximately an 80% detection rate for Down syndrome and can be positive for other chromosome abnormalities as well as heart defects.  When combined with a maternal serum marker screening, the detection rate is up to 90% for Down syndrome and up to 97% for trisomy 18.     The chorionic villus sampling procedure is available for first trimester chromosome analysis.  This involves the withdrawal of a small amount of chorionic villi (tissue from the developing placenta).  Risk of pregnancy loss is estimated to be approximately 1 in 200 to 1 in 100  (0.5 to 1%).  There is approximately a 1% (1 in 100) chance that the CVS chromosome results will be unclear.  Chorionic villi cannot be tested for neural tube defects.     Maternal serum marker screening, a blood test that measures pregnancy proteins, can provide risk assessments for Down syndrome, trisomy 18, and open neural tube defects (spina bifida, anencephaly). Because it does not directly examine the fetus, it cannot positively diagnose or rule out these problems.  Targeted ultrasound uses high frequency sound waves to create an image of the developing fetus.  An ultrasound is often recommended as a routine means of evaluating the pregnancy.  It is also used to screen for fetal anatomy problems (for example, a heart defect) that might be suggestive of a chromosomal or other abnormality.   Amniocentesis involves the removal of a small amount of amniotic fluid from the sac surrounding the fetus with the use of a thin needle inserted through the maternal abdomen and uterus.  Ultrasound guidance is used throughout the procedure.  Fetal cells from amniotic fluid are directly evaluated and > 99.5% of chromosome problems and > 98% of open neural tube defects can be detected. This procedure is generally performed after the 15th week of pregnancy.  The main risks to this procedure include complications leading to miscarriage in less than 1 in 200 cases (0.5%).  We also reviewed the availability of cell free fetal DNA testing from maternal blood to determine whether or not the baby may have either Down syndrome, trisomy 3913, or trisomy 4118.  This test utilizes a maternal  blood sample and DNA sequencing technology to isolate circulating cell free fetal DNA from maternal plasma.  The fetal DNA can then be analyzed for DNA sequences that are derived from the three most common chromosomes involved in aneuploidy, chromosomes 13, 18, and 21.  If the overall amount of DNA is greater than the expected level for any of  these chromosomes, aneuploidy is suspected.  While we do not consider it a replacement for invasive testing and karyotype analysis, a negative result from this testing would be reassuring, though not a guarantee of a normal chromosome complement for the baby.  An abnormal result is certainly suggestive of an abnormal chromosome complement, though we would still recommend CVS or amniocentesis to confirm any findings from this testing.  Cystic Fibrosis and Spinal Muscular Atrophy (SMA) screening were also discussed with the patient. Both conditions are recessive, which means that both parents must be carriers in order to have a child with the disease.  Cystic fibrosis (CF) is one of the most common genetic conditions in persons of Caucasian ancestry.  This condition occurs in approximately 1 in 2,500 Caucasian persons and results in thickened secretions in the lungs, digestive, and reproductive systems.  For a baby to be at risk for having CF, both of the parents must be carriers for this condition.  Approximately 1 in 9125 Caucasian persons is a carrier for CF.  Current carrier testing looks for the most common mutations in the gene for CF and can detect approximately 90% of carriers in the Caucasian population.  This means that the carrier screening can greatly reduce, but cannot eliminate, the chance for an individual to have a child with CF.  If an individual is found to be a carrier for CF, then carrier testing would be available for the partner. As part of [place]'s newborn screening profile, all babies born in the state of [place] will have a two-tier screening process.  Specimens are first tested to determine the concentration of immunoreactive trypsinogen (IRT).  The top 5% of specimens with the highest IRT values then undergo DNA testing using a panel of over 40 common CF mutations. SMA is a neurodegenerative disorder that leads to atrophy of skeletal muscle and overall weakness.  This  condition is also more prevalent in the Caucasian population, with 1 in 40-1 in 60 persons being a carrier and 1 in 6,000-1 in 10,000 children being affected.  There are multiple forms of the disease, with some causing death in infancy to other forms with survival into adulthood.  The genetics of SMA is complex, but carrier screening can detect up to 95% of carriers in the Caucasian population.  Similar to CF, a negative result can greatly reduce, but cannot eliminate, the chance to have a child with SMA.  We obtained a detailed family history and pregnancy history.  Ms. Mariah [name] reported one paternal first cousin once removed who is being evaluated for developmental concerns and found to have polymicrogyria.  This is a term to describe the brain changes seen on imaging.  There are different types of polymicrogyria with varying severity and features.  In addition, it may occur as an isolated finding or as part of several genetic syndromes.  If anything is learned about the underlying cause, we are happy to discuss possible risks to other family members.  She also reported that her mother passed away from ALS.  While this condition may have strong inherited factors in 5-10% of individuals, most cases occur in  one family member with a low risk to other relatives.  Lastly, Ms. Frede had a paternal aunt who passed away from cervical cancer who had a daughter who died at 41 years old from rhabdomyosarcoma. Most cases of cervical cancer are no expected to be due to genetic factors.   Similarly, most cases of rhabdomyosarcoma occur sporadically.  It can be associated with some genetic syndromes, including Neurofibromatosis, but more details about the diagnosis would be needed to determine if other family members would be at increased risk.  Arlys John, the father of the baby, stated that he has a maternal first cousin once removed with a structural heart defect who is healthy and developmentally normal following cardiac  surgery.  Given that this is a fifth degree relative to this pregnancy, we would not expect an increased risk due to this history.   The remainder of the family history is unremarkable for birth defects, developmental delays, recurrent pregnancy loss or known chromosome abnormalities.  Ms. Zunker stated that this is her first pregnancy.  She reported no complications or exposures that would be expected to increase the risk for birth defects.  After consideration of the options, Ms. Donlon elected to proceed with cell free fetal DNA testing.  The declined carrier screening for CF and SMA.    An ultrasound was performed at the time of the visit.  The gestational age was consistent with  12 weeks.  Fetal anatomy could not be assessed due to early gestational age.  Please refer to the ultrasound report for details of that study.  We recommend a detailed anatomy ultrasound at [redacted] weeks gestation and a fetal echocardiogram after [redacted] weeks gestation to due maternal age of 38 years at delivery.  Ms. Strieter was encouraged to call with questions or concerns.  We can be contacted at [phone].   Tests Ordered: MaterniT21 PLUS with [facility]    Cherly Anderson, MS, CGC

## 2018-03-24 ENCOUNTER — Other Ambulatory Visit: Payer: Self-pay | Admitting: Infectious Diseases

## 2018-03-24 MED ORDER — FOSFOMYCIN TROMETHAMINE 3 G PO PACK: 3.0000 g | PACK | Freq: Once | ORAL | 0 refills | Status: AC

## 2018-03-24 NOTE — Progress Notes (Signed)
Called patient and informed her that we will be doing 1 dose fosfomycin and check urine culture in 2 weeks- not to have sex in 2 weeks.

## 2018-03-28 LAB — MATERNIT21 PLUS CORE+SCA
Chromosome 13: NEGATIVE
Chromosome 18: NEGATIVE
Chromosome 21: NEGATIVE
Fetal Fraction: 9
Y Chromosome: NOT DETECTED

## 2018-03-30 ENCOUNTER — Telehealth: Payer: Self-pay | Admitting: Obstetrics and Gynecology

## 2018-03-30 NOTE — Progress Notes (Signed)
Pt seen by me, agree with assessment and plan as outlined in [facility] Wells's note 

## 2018-03-30 NOTE — Telephone Encounter (Signed)
The patient was informed of the results of her recent MaterniT21 testing which yielded NEGATIVE results.  The patient's specimen showed DNA consistent with two copies of chromosomes 21, 18 and 13.  The sensitivity for trisomy 21, trisomy 18 and trisomy 13 using this testing are reported as 99.1%, 99.9% and 91.7% respectively.  Thus, while the results of this testing are highly accurate, they are not considered diagnostic at this time.  Should more definitive information be desired, the patient may still consider amniocentesis.   As requested to know by the patient, sex chromosome analysis was included for this sample.  Results are consistent with a female fetus (NO Y chromosome material detected). This is predicted with >99% accuracy.  A maternal serum AFP only should be considered if screening for neural tube defects is desired.  We may be reached at [phone] with any questions or concerns.   Shirely Toren F. Kayzen Kendzierski, MS, CGC   

## 2018-04-10 ENCOUNTER — Encounter: Payer: BLUE CROSS/BLUE SHIELD | Admitting: Physical Therapy

## 2018-04-17 ENCOUNTER — Ambulatory Visit: Payer: BLUE CROSS/BLUE SHIELD | Attending: Obstetrics and Gynecology | Admitting: Physical Therapy

## 2018-04-17 DIAGNOSIS — R293 Abnormal posture: Secondary | ICD-10-CM | POA: Insufficient documentation

## 2018-04-17 DIAGNOSIS — R278 Other lack of coordination: Secondary | ICD-10-CM | POA: Diagnosis present

## 2018-04-17 DIAGNOSIS — M62838 Other muscle spasm: Secondary | ICD-10-CM | POA: Insufficient documentation

## 2018-04-17 NOTE — Patient Instructions (Addendum)
Two pelvic floor stretches:  "v heels slide away and then back toward buttocks and then rock knee to slight ,  slide heel along at 11 o clock away from buttocks   10 reps  ___  One knee out 45 deg on exhale   10 reps    ___   3 foot ankle exercises   Standing lunges  ( heel and toes straight of the back foot)   10 reps on both    PLEATS: BALLERINA  Heel raises in ballerina position, pressing rolled towel between heels  Hands at a corner of a wall   Knees bent pointed out like a "v" , navel ( center of mass) more forward  Heels together as you lift, pointed out like a "v"  KNEES ARE ALIGNED BEHIND THE TOES TO MINIMIZE STRAIN ON THE KNEES Lower heels down slow with your  navel ( center of mass) more forward to avoid dropping down fast and rocking more weight back onto heels   10 reps  ________   Elba Barman WITH RESISTANCE BLUE Band at waist connected to doorknob Stepping forward normal length steps, planting mid and forefoot down, center of mass ( navel) leans forward slightly as if you were walking uphill 3-4 steps till band feels taut ( MAKE SURE THE DOOR IS LOCKED AND WON'T OPEN)   Stepping backwards, lower heel slowly, carry trunk and hips back as you step Both    ____ Consult your doctor about probiotics for vaginal tract

## 2018-04-18 NOTE — Therapy (Signed)
[place] MAIN [facility] SERVICES [address] [place], [place], [address] Phone: [phone]   Fax:  [phone]  Physical Therapy Treatment [place] [facility] Note  Patient Details  Name: Mariah [name] MRN: [id number] Date of Birth: March 24, 1980 Referring Provider (PT): Benjaman Kindler    Encounter Date: 04/17/2018  PT End of Session - 04/18/18 1905    Visit Number  5    Number of Visits  12    PT Start Time  6962    PT Stop Time  9528    PT Time Calculation (min)  59 min    Activity Tolerance  Patient tolerated treatment well    Behavior During Therapy  [facility] for tasks assessed/performed       Past Medical History:  Diagnosis Date  . Infection    Current UTI January 2020  . Urinary tract infection due to extended-spectrum beta lactamase (ESBL) producing Escherichia coli 03/23/2018  . Vaginal Pap smear, abnormal    HPV    No past surgical history on file.  There were no vitals filed for this visit.  Subjective Assessment - 04/17/18 1712    Subjective  Pt reports pain with intercourse and causing UTIs. In Jan, pt had a UTI and took antibiotics. Two weeks later, pt had UTI again. Pt had to go to the Infectious Disease Department and she took another antibiotic.  Pt is in 16th week of pregnancy.  Pt has been told that her UTIs will keep occuring.  Pt has increased her water intake. Burning with urination occurred after a long period of time with not having intercourse.             [facility] PT Assessment - 04/17/18 1719      Single Leg Stance   Comments  poor eccentric control with ankle instability on descent on B ankles      Ambulation/Gait   Gait Comments  medial collapse on B ankles                Pelvic Floor Special Questions - 04/18/18 1905    External Perineal Exam  increased tightness/ tenderness at B ischiocavernosus         [facility] Adult PT Treatment/Exercise - 04/18/18 1858      Ambulation/Gait   Gait Comments   medial collapse on B ankles      Therapeutic Activites    Therapeutic Activities  Other Therapeutic Activities    Other Therapeutic Activities  Deferred pt to discuss with MD re: probiotics for inestinal/ vaginal tract to minimize UTIs and promote healthy pregnancy      Neuro Re-ed    Neuro Re-ed Details   cued for increased hip /knee flexion, co-activation of transverse arches, longer strides to minimize pronation      Manual Therapy   Manual therapy comments  AP mob at midfoot joints to promote dorsiflexion L , external STM over ischiocavernosus B,     external: STM/MWM at anterior pelvic floor mm noted in asses                 PT Long Term Goals - 04/17/18 1727      PT LONG TERM GOAL #1   Title  Pt will demo no tenderness/ tensions of pelvic floor and demo proepr ROM in order to minimize pelvic pain to resume ADLs    Time  6    Period  Weeks    Status  On-going      PT LONG  TERM GOAL #2   Title  Pt will demo increased spinal/pelvic mobility and IND with work stretches and compliance to ergonomic workstation recommendations to promote proper circulation to pelvic area and counteract negative health effects of sedentary work week      Time  4    Period  Weeks    Status  Achieved      PT LONG TERM GOAL #3   Title  Pt will demo proper sitting posture without cues in order to minimize straining pelvic floor     Time  2    Period  Weeks    Status  Achieved      PT LONG TERM GOAL #4   Title  Pt will report decreased numbness/tingling in soles and palms upon waking across 5 days     Time  4    Period  Weeks    Status  Achieved      PT LONG TERM GOAL #5   Title  Pt will decrease her back pain by 50% across 1 week in order to perform weighted activities     Time  10    Period  Weeks    Status  Not Met      Additional Long Term Goals   Additional Long Term Goals  Yes            Plan - 04/18/18 1905    Clinical Impression Statement  Pt has achieved 3/6  goals and is progress well towards her remaining goals. Pt continued to show increased anterior pelvic floor mm tightness which is likely related to recurrent UTI due to limited pelvic floor lengthen.  Deferred pt to discuss with MD re: probiotics for intestinal/ vaginal tract to minimize UTIs and promote healthy pregnancy .  Addressed pelvic floor overactivity with regional interdependent  approach to minimize relapse by addressing lower kinetic chain which showed limited DF / hip /knee flexion and subtalar inversion /pronation.  Pt showed improved gait after training in addition to increased pelvic floor lengthening. Pt continues to benefit from skilled PT.    Rehab Potential  Good    PT Frequency  1x / week    PT Duration  12 weeks    PT Treatment/Interventions  Therapeutic activities;Functional mobility training;Neuromuscular re-education;Therapeutic exercise;Gait training;Moist Heat;Stair training;Patient/family education;Manual techniques;Scar mobilization;Electrical Stimulation;Manual lymph drainage;Aquatic Therapy    Consulted and Agree with Plan of Care  Patient       Patient will benefit from skilled therapeutic intervention in order to improve the following deficits and impairments:  Hypomobility, Improper body mechanics, Decreased strength, Decreased safety awareness, Increased muscle spasms, Decreased mobility, Decreased endurance, Decreased balance, Decreased range of motion, Decreased coordination, Postural dysfunction, Pain  Visit Diagnosis: Other lack of coordination  Abnormal posture  Other muscle spasm     Problem List Patient Active Problem List   Diagnosis Date Noted  . Urinary tract infection due to extended-spectrum beta lactamase (ESBL) producing Escherichia coli 03/23/2018    Jerl Mina ,PT, DPT, E-RYT  04/18/2018, 7:08 PM  [place] MAIN [facility] SERVICES [address] [place], [place], [address] Phone: [phone]    Fax:  [phone]  Name: KAMORIE ALDOUS MRN: [id number] Date of Birth: [birth year]

## 2018-05-01 ENCOUNTER — Ambulatory Visit: Payer: BLUE CROSS/BLUE SHIELD | Attending: Obstetrics and Gynecology | Admitting: Physical Therapy

## 2018-05-01 DIAGNOSIS — R278 Other lack of coordination: Secondary | ICD-10-CM | POA: Diagnosis present

## 2018-05-01 DIAGNOSIS — M62838 Other muscle spasm: Secondary | ICD-10-CM | POA: Insufficient documentation

## 2018-05-01 DIAGNOSIS — R293 Abnormal posture: Secondary | ICD-10-CM | POA: Insufficient documentation

## 2018-05-01 NOTE — Patient Instructions (Addendum)
[email or web address]  [email or web address] has blog pre/ post pregnancy    Body pillow, Breast feeding pillow  Bellies belly wrap   ____   Folded towel under belly when sidelying   Coordinated clam shells 20 reps each side daily   Keep up with pelvic floor/ LE stretches.  Keep up with deep core level 1 ( breathing for pelvic floor lengthening)    _____ Wear shoes with good sole support

## 2018-05-02 NOTE — Therapy (Signed)
[place] [facility] MAIN [facility] SERVICES [address] [place], [place], [address] Phone: [phone]   Fax:  [phone]  Physical Therapy Treatment  Patient Details  Name: Mariah [name] MRN: [id number] Date of Birth: 08/15/1980 Referring Provider (PT): Christeen Douglas    Encounter Date: 05/01/2018  PT End of Session - 05/01/18 1744    Visit Number  6    Number of Visits  12    Date for PT Re-Evaluation  07/11/18    PT Start Time  1705    PT Stop Time  1726    PT Time Calculation (min)  21 min    Activity Tolerance  Patient tolerated treatment well    Behavior During Therapy  [facility] for tasks assessed/performed       Past Medical History:  Diagnosis Date  . Infection    Current UTI January 2020  . Urinary tract infection due to extended-spectrum beta lactamase (ESBL) producing Escherichia coli 03/23/2018  . Vaginal Pap smear, abnormal    HPV    No past surgical history on file.  There were no vitals filed for this visit.  Subjective Assessment - 05/02/18 2220    Subjective  Pt reports she had less pelvic pain and her UTI has cleared.          [facility] PT Assessment - 05/02/18 2153      Ambulation/Gait   Gait Comments  less medial collapse.of ankle                 Pelvic Floor Special Questions - 05/02/18 2152    External Perineal Exam  no pelvic floor tightness  noted         [facility] Adult PT Treatment/Exercise - 05/02/18 2151      Therapeutic Activites    Other Therapeutic Activities  provided pregnancy education: recommended belly brace, shoe wear, book, blog info                   PT Long Term Goals - 05/02/18 2156      PT LONG TERM GOAL #1   Title  Pt will demo no tenderness/ tensions of pelvic floor and demo proepr ROM in order to minimize pelvic pain to resume ADLs    Time  6    Period  Weeks    Status  Achieved      PT LONG TERM GOAL #2   Title  Pt will demo increased spinal/pelvic mobility and  IND with work stretches and compliance to ergonomic workstation recommendations to promote proper circulation to pelvic area and counteract negative health effects of sedentary work week      Time  4    Period  Weeks    Status  Achieved      PT LONG TERM GOAL #3   Title  Pt will demo proper sitting posture without cues in order to minimize straining pelvic floor     Time  2    Period  Weeks    Status  Achieved      PT LONG TERM GOAL #4   Title  Pt will report decreased numbness/tingling in soles and palms upon waking across 5 days     Time  4    Period  Weeks    Status  Achieved      PT LONG TERM GOAL #5   Title  Pt will report minimal LBP during 2nd trimester in order to perform ADLs     Time  12    Period  Weeks    Status  Revised            Plan - 05/02/18 2154    Clinical Impression Statement Pt returned today with significantly less pelvic floor tightness compared to last week. Pt also showed improved ankle mobility and less medial collapse which will help minimize anterior pelvic floor mm tightness. Pt was educated about pregnancy related preventative measures to minimize LBP, strengthening hips as pt is in her 1st trimester of 1st pregnancy. Pt was provided information about pregnancy belt, blog, and a pre/post partum book. Session was abbreviated as pt demo'd IND with prior exercises for minimizing pelvic floor tightness/ LBP from prior sessions. Pt will continues to benefit from skilled PT if she has relapsed Sx of pelvic floor/ LBP as her pregnancy progresses.    Rehab Potential  Good   .  PT Frequency  1x / week    PT Duration  12 weeks    PT Treatment/Interventions  Therapeutic activities;Functional mobility training;Neuromuscular re-education;Therapeutic exercise;Gait training;Moist Heat;Stair training;Patient/family education;Manual techniques;Scar mobilization;Electrical Stimulation;Manual lymph drainage;Aquatic Therapy    Consulted and Agree with Plan of Care   Patient       Patient will benefit from skilled therapeutic intervention in order to improve the following deficits and impairments:  Hypomobility, Improper body mechanics, Decreased strength, Decreased safety awareness, Increased muscle spasms, Decreased mobility, Decreased endurance, Decreased balance, Decreased range of motion, Decreased coordination, Postural dysfunction, Pain  Visit Diagnosis: Other lack of coordination - Plan: PT plan of care cert/re-cert  Abnormal posture - Plan: PT plan of care cert/re-cert  Other muscle spasm - Plan: PT plan of care cert/re-cert     Problem List Patient Active Problem List   Diagnosis Date Noted  . Urinary tract infection due to extended-spectrum beta lactamase (ESBL) producing Escherichia coli 03/23/2018    Mariane Masters ,PT, DPT, E-RYT  05/02/2018, 10:20 PM  [place] [facility] MAIN [facility] SERVICES [address] [place], [place], [address] Phone: [phone]   Fax:  [phone]  Name: Mariah [name] MRN: [id number] Date of Birth: 1981/01/06

## 2018-05-04 ENCOUNTER — Other Ambulatory Visit: Payer: BLUE CROSS/BLUE SHIELD

## 2018-06-08 ENCOUNTER — Encounter: Payer: BLUE CROSS/BLUE SHIELD | Admitting: Physical Therapy

## 2018-06-22 ENCOUNTER — Encounter: Payer: BLUE CROSS/BLUE SHIELD | Admitting: Physical Therapy

## 2018-09-01 ENCOUNTER — Other Ambulatory Visit: Payer: Self-pay

## 2018-09-01 ENCOUNTER — Inpatient Hospital Stay
Admission: EM | Admit: 2018-09-01 | Discharge: 2018-09-06 | DRG: 832 | Disposition: A | Discharge status: Home / Self Care | Payer: BC Managed Care – PPO | Attending: Obstetrics & Gynecology | Admitting: Obstetrics & Gynecology

## 2018-09-01 DIAGNOSIS — M549 Dorsalgia, unspecified: Secondary | ICD-10-CM | POA: Diagnosis present

## 2018-09-01 DIAGNOSIS — D649 Anemia, unspecified: Secondary | ICD-10-CM | POA: Diagnosis present

## 2018-09-01 DIAGNOSIS — R05 Cough: Secondary | ICD-10-CM

## 2018-09-01 DIAGNOSIS — R109 Unspecified abdominal pain: Secondary | ICD-10-CM

## 2018-09-01 DIAGNOSIS — O2343 Unspecified infection of urinary tract in pregnancy, third trimester: Principal | ICD-10-CM | POA: Diagnosis present

## 2018-09-01 DIAGNOSIS — Z87891 Personal history of nicotine dependence: Secondary | ICD-10-CM

## 2018-09-01 DIAGNOSIS — B9620 Unspecified Escherichia coli [E. coli] as the cause of diseases classified elsewhere: Secondary | ICD-10-CM | POA: Diagnosis present

## 2018-09-01 DIAGNOSIS — O99013 Anemia complicating pregnancy, third trimester: Secondary | ICD-10-CM | POA: Diagnosis present

## 2018-09-01 DIAGNOSIS — O99891 Other specified diseases and conditions complicating pregnancy: Secondary | ICD-10-CM

## 2018-09-01 DIAGNOSIS — R6510 Systemic inflammatory response syndrome (SIRS) of non-infectious origin without acute organ dysfunction: Secondary | ICD-10-CM

## 2018-09-01 DIAGNOSIS — O99113 Other diseases of the blood and blood-forming organs and certain disorders involving the immune mechanism complicating pregnancy, third trimester: Secondary | ICD-10-CM | POA: Diagnosis present

## 2018-09-01 DIAGNOSIS — Z20828 Contact with and (suspected) exposure to other viral communicable diseases: Secondary | ICD-10-CM | POA: Diagnosis present

## 2018-09-01 DIAGNOSIS — R030 Elevated blood-pressure reading, without diagnosis of hypertension: Secondary | ICD-10-CM | POA: Diagnosis present

## 2018-09-01 DIAGNOSIS — R10A1 Flank pain, right side: Secondary | ICD-10-CM

## 2018-09-01 DIAGNOSIS — O26893 Other specified pregnancy related conditions, third trimester: Secondary | ICD-10-CM | POA: Diagnosis present

## 2018-09-01 DIAGNOSIS — A4151 Sepsis due to Escherichia coli [E. coli]: Secondary | ICD-10-CM | POA: Diagnosis present

## 2018-09-01 DIAGNOSIS — Z3A35 35 weeks gestation of pregnancy: Secondary | ICD-10-CM

## 2018-09-01 DIAGNOSIS — R059 Cough, unspecified: Secondary | ICD-10-CM

## 2018-09-01 DIAGNOSIS — R7881 Bacteremia: Secondary | ICD-10-CM

## 2018-09-01 DIAGNOSIS — D696 Thrombocytopenia, unspecified: Secondary | ICD-10-CM | POA: Diagnosis present

## 2018-09-01 LAB — COMPREHENSIVE METABOLIC PANEL
ALT: 19 U/L (ref 0–44)
AST: 21 U/L (ref 15–41)
Albumin: 2.8 g/dL — ABNORMAL LOW (ref 3.5–5.0)
Alkaline Phosphatase: 90 U/L (ref 38–126)
Anion gap: 9 (ref 5–15)
BUN: 9 mg/dL (ref 6–20)
CO2: 17 mmol/L — ABNORMAL LOW (ref 22–32)
Calcium: 7.9 mg/dL — ABNORMAL LOW (ref 8.9–10.3)
Chloride: 108 mmol/L (ref 98–111)
Creatinine, Ser: 0.55 mg/dL (ref 0.44–1.00)
GFR calc Af Amer: >60 mL/min (ref >60)
GFR calc non Af Amer: >60 mL/min (ref >60)
Glucose, Bld: 97 mg/dL (ref 70–99)
Potassium: 3.3 mmol/L — ABNORMAL LOW (ref 3.5–5.1)
Sodium: 134 mmol/L — ABNORMAL LOW (ref 135–145)
Total Bilirubin: 0.8 mg/dL (ref 0.3–1.2)
Total Protein: 6.4 g/dL — ABNORMAL LOW (ref 6.5–8.1)

## 2018-09-01 LAB — CBC
HCT: 33.4 % — ABNORMAL LOW (ref 36.0–46.0)
Hemoglobin: 11 g/dL — ABNORMAL LOW (ref 12.0–15.0)
MCH: 30.8 pg (ref 26.0–34.0)
MCHC: 32.9 g/dL (ref 30.0–36.0)
MCV: 93.6 fL (ref 80.0–100.0)
Platelets: 133 10*3/uL — ABNORMAL LOW (ref 150–400)
RBC: 3.57 MIL/uL — ABNORMAL LOW (ref 3.87–5.11)
RDW: 13.1 % (ref 11.5–15.5)
WBC: 10.6 10*3/uL — ABNORMAL HIGH (ref 4.0–10.5)
nRBC: 0 % (ref 0.0–0.2)

## 2018-09-01 LAB — URINALYSIS, COMPLETE (UACMP) WITH MICROSCOPIC
Bilirubin Urine: NEGATIVE
Glucose, UA: NEGATIVE mg/dL
Ketones, ur: NEGATIVE mg/dL
Nitrite: NEGATIVE
Protein, ur: 100 mg/dL — AB
Specific Gravity, Urine: 1.011 (ref 1.005–1.030)
WBC, UA: >50 WBC/hpf — ABNORMAL HIGH (ref 0–5)
pH: 7 (ref 5.0–8.0)

## 2018-09-01 LAB — TYPE AND SCREEN
ABO/RH(D): O POS
Antibody Screen: NEGATIVE

## 2018-09-01 LAB — PROTEIN / CREATININE RATIO, URINE
Creatinine, Urine: 59 mg/dL
Protein Creatinine Ratio: 1.12 mg/mg{Cre} — ABNORMAL HIGH (ref 0.00–0.15)
Total Protein, Urine: 66 mg/dL

## 2018-09-01 LAB — SARS CORONAVIRUS 2 BY RT PCR (HOSPITAL ORDER, PERFORMED IN ~~LOC~~ HOSPITAL LAB): SARS Coronavirus 2: NEGATIVE

## 2018-09-01 MED ORDER — LACTATED RINGERS IV SOLN
INTRAVENOUS | Status: DC
  Administered 2018-09-01 – 2018-09-05 (×11): via INTRAVENOUS

## 2018-09-01 MED ORDER — LACTATED RINGERS IV BOLUS
1000.0000 mL | Freq: Once | INTRAVENOUS | Status: AC
  Administered 2018-09-01: 1000 mL via INTRAVENOUS

## 2018-09-01 MED ORDER — ACETAMINOPHEN 325 MG PO TABS
650.0000 mg | ORAL_TABLET | ORAL | Status: DC | PRN
  Administered 2018-09-01 – 2018-09-02 (×5): 650 mg via ORAL
  Filled 2018-09-01 (×5): qty 2

## 2018-09-01 MED ORDER — CALCIUM CARBONATE ANTACID 500 MG PO CHEW
2.0000 | CHEWABLE_TABLET | ORAL | Status: DC | PRN
  Administered 2018-09-04: 400 mg via ORAL
  Filled 2018-09-01: qty 2

## 2018-09-01 MED ORDER — ONDANSETRON HCL 4 MG/2ML IJ SOLN
4.0000 mg | Freq: Four times a day (QID) | INTRAMUSCULAR | Status: DC | PRN
  Administered 2018-09-02 – 2018-09-03 (×6): 4 mg via INTRAVENOUS
  Filled 2018-09-01 (×6): qty 2

## 2018-09-01 MED ORDER — SODIUM CHLORIDE 0.9 % IV SOLN
1.0000 g | INTRAVENOUS | Status: DC
  Administered 2018-09-01: 1 g via INTRAVENOUS
  Filled 2018-09-01: qty 1
  Filled 2018-09-01: qty 10

## 2018-09-01 NOTE — Progress Notes (Signed)
Mariah [name] is a 38 y.o. female. She is at [redacted]w[redacted]d gestation. Patient's last menstrual period was 12/26/2017. Estimated Date of Delivery: 10/02/18   S: Resting comfortably. Feeling hungry.    Maternal Medical History:   Past Medical History:  Diagnosis Date  . Infection    Current UTI January 2020  . Urinary tract infection due to extended-spectrum beta lactamase (ESBL) producing Escherichia coli 03/23/2018  . Vaginal Pap smear, abnormal    HPV    History reviewed. No pertinent surgical history.  No Known Allergies  Prior to Admission medications   Medication Sig Start Date End Date Taking? Authorizing Provider  cetirizine (ZYRTEC) 10 MG tablet Take 10 mg by mouth daily.   Yes [provider]  fluticasone (VERAMYST) 27.5 MCG/SPRAY nasal spray Place 2 sprays into the nose daily.   Yes [provider]  montelukast (SINGULAIR) 10 MG tablet Take 10 mg by mouth at bedtime.   Yes [provider]  Prenatal Vit-Fe Fumarate-FA (PRENATAL MULTIVITAMIN) TABS tablet Take 1 tablet by mouth daily at 12 noon.   Yes [provider]  pyridoxine (B-6) 100 MG tablet Take 100 mg by mouth daily.   Yes [provider]     Social History: She  reports that she has quit smoking. Her smoking use included cigarettes. She started smoking about 6 years ago. She has a 9.50 pack-year smoking history. She has never used smokeless tobacco. She reports previous alcohol use. She reports that she does not use drugs.  Family History: family history includes ALS in her mother; Arthritis in her maternal grandmother; Asthma in her paternal grandfather; Cancer in her paternal aunt; Emphysema in her father; Glaucoma in her paternal grandfather; Heart disease in her maternal grandfather; Lumbar disc disease in her father; Osteopenia in her paternal grandmother.   Review of Systems: A full review of systems was performed and negative except as noted in the HPI.     O:  BP (!) 113/53 (BP Location: Right Arm)   Pulse (!) 122   Temp 99.1 F (37.3 C) (Axillary)   Resp 20   Ht 5\' 10"  (1.778 m)   Wt 93 kg   LMP 12/26/2017   SpO2 97%   BMI 29.41 kg/m  Results for orders placed or performed during the hospital encounter of 09/01/18 (from the past 48 hour(s))  Comprehensive metabolic panel   Collection Time: 09/01/18  5:06 PM  Result Value Ref Range   Sodium 134 (L) 135 - 145 mmol/L   Potassium 3.3 (L) 3.5 - 5.1 mmol/L   Chloride 108 98 - 111 mmol/L   CO2 17 (L) 22 - 32 mmol/L   Glucose, Bld 97 70 - 99 mg/dL   BUN 9 6 - 20 mg/dL   Creatinine, Ser 0.55 0.44 - 1.00 mg/dL   Calcium 7.9 (L) 8.9 - 10.3 mg/dL   Total Protein 6.4 (L) 6.5 - 8.1 g/dL   Albumin 2.8 (L) 3.5 - 5.0 g/dL   AST 21 15 - 41 U/L   ALT 19 0 - 44 U/L   Alkaline Phosphatase 90 38 - 126 U/L   Total Bilirubin 0.8 0.3 - 1.2 mg/dL   GFR calc non Af Amer >60 >60 mL/min   GFR calc Af Amer >60 >60 mL/min   Anion gap 9 5 - 15  CBC on admission   Collection Time: 09/01/18  5:06 PM  Result Value Ref Range   WBC 10.6 (H) 4.0 - 10.5 K/uL   RBC  3.57 (L) 3.87 - 5.11 MIL/uL   Hemoglobin 11.0 (L) 12.0 - 15.0 g/dL   HCT 69.633.4 (L) 29.536.0 - 28.446.0 %   MCV 93.6 80.0 - 100.0 fL   MCH 30.8 26.0 - 34.0 pg   MCHC 32.9 30.0 - 36.0 g/dL   RDW 13.213.1 44.011.5 - 10.215.5 %   Platelets 133 (L) 150 - 400 K/uL   nRBC 0.0 0.0 - 0.2 %  Type and screen [facility]   Collection Time: 09/01/18  5:06 PM  Result Value Ref Range   ABO/RH(D) PENDING    Antibody Screen PENDING    Sample Expiration      09/04/2018,2359 Performed at [facility], [address] [place], [place] [address]   Protein / creatinine ratio, urine   Collection Time: 09/01/18  5:07 PM  Result Value Ref Range   Creatinine, Urine 59 mg/dL   Total Protein, Urine 66 mg/dL   Protein Creatinine Ratio 1.12 (H) 0.00 - 0.15 mg/mg[Cre]  Urinalysis, Complete w Microscopic   Collection Time: 09/01/18  5:07 PM  Result Value Ref  Range   Color, Urine YELLOW (A) YELLOW   APPearance CLOUDY (A) CLEAR   Specific Gravity, Urine 1.011 1.005 - 1.030   pH 7.0 5.0 - 8.0   Glucose, UA NEGATIVE NEGATIVE mg/dL   Hgb urine dipstick SMALL (A) NEGATIVE   Bilirubin Urine NEGATIVE NEGATIVE   Ketones, ur NEGATIVE NEGATIVE mg/dL   Protein, ur 644100 (A) NEGATIVE mg/dL   Nitrite NEGATIVE NEGATIVE   Leukocytes,Ua LARGE (A) NEGATIVE   RBC / HPF 6-10 0 - 5 RBC/hpf   WBC, UA >50 (H) 0 - 5 WBC/hpf   Bacteria, UA MANY (A) NONE SEEN   Squamous Epithelial / LPF 0-5 0 - 5   Mucus PRESENT   SARS Coronavirus 2 (CEPHEID - Performed in [facility] lab), [facility] Order   Collection Time: 09/01/18  5:30 PM   Specimen: Nasopharyngeal Swab  Result Value Ref Range   SARS Coronavirus 2 NEGATIVE NEGATIVE     Constitutional: NAD, AAOx3  HE/ENT: extraocular movements grossly intact, moist mucous membranes CV: RRR PULM: normal respiratory effort, CTABL     Abd: gravid, non-tender, non-distended, soft      Ext: Non-tender, Nonedmeatous   Psych: mood appropriate, speech normal Pelvic deferred  NST/monitoring:  Baseline: 155bpm Variability: moderate Accelerations: 15x15 present Decelerations: absent Time: 2hours 30mins Toco: quiet   A/P: 38 y.o. 6580w4d here for antenatal surveillance during pregnancy.  Principle diagnosis: Urinary tract infection, concern for pyelonephritisi  Labor ? Not present  Fetal Wellbeing ? Baseline initially 195bpm with moderate variability and accelerations, now 155bpm with moderate variability and accelerations. Overall reassuring.   Back pain ? Tenderness to palpation, without acute CVA tenderness ? Tylenol PRN, K-pad PRN  UTI vs pyelonephritis ? UA with many bacteria, many WBCs, large leukocytes; urine culture added ? Ordered ceftriaxone 1g IV q24 hours ? CBC with WBC 10.6k, plan repeat in the morning  Elevated blood pressure ? Initial blood pressure readings 140s-150s-80s-90 ? Platelets 133k,  P/C ratio elevated at 1.12 ? Plan to monitor BPs hourly  Plan for observation overnight, with repeat CBC in the morning. Plan for continued IV antibiotics with PO when discharged home. Plan to continue fetal monitoring until fetal heart rate baseline remains normal for 1-2 hours. Discussed plan of care with Dr. Elesa MassedWard, who is in agreement.    Genia DelMargaret Seville Brick 09/01/2018 7:11 PM  ----- Genia DelMargaret Cherylynn Liszewski, CNM Certified Nurse Midwife [facility], Department of OB/GYN [facility]

## 2018-09-01 NOTE — H&P (Signed)
Mariah [name] is a 38 y.o. female. She is at [redacted]w[redacted]d gestation. Patient's last menstrual period was 12/26/2017. Estimated Date of Delivery: 10/02/18  Prenatal care site: [facility]   Chief complaint: back pain Location: entire back, but worse across lower back Onset/timing: two days ago Duration: constant, but waxes and wanes in intensity  Quality: achy  Severity: mild to severe Aggravating or alleviating conditions: none Associated signs/symptoms: pain that radiates to lower abdomen Context: Mariah [name] reports back pain that first started two nights ago. It woke her up from sleep and made her feel nauseous. She has been taking Tylenol regularly for the past day with no relief in symptoms. She reports some chills and shakes yesterday evening. She also vomited after drinking a lot of water yesterday evening and has continued to have intermittent nausea. She vomited just after arriving to triage today. She is voiding small amounts frequently. Her temperature has been checked daily at work and she reports she has been afebrile.   S: Resting comfortably.   She reports:  -active fetal movement -no leakage of fluid -no vaginal bleeding -no contractions  Maternal Medical History:   Past Medical History:  Diagnosis Date  . Infection    Current UTI January 2020  . Urinary tract infection due to extended-spectrum beta lactamase (ESBL) producing Escherichia coli 03/23/2018  . Vaginal Pap smear, abnormal    HPV    History reviewed. No pertinent surgical history.  No Known Allergies  Prior to Admission medications   Medication Sig Start Date End Date Taking? Authorizing Provider  cetirizine (ZYRTEC) 10 MG tablet Take 10 mg by mouth daily.   Yes [provider]  fluticasone (VERAMYST) 27.5 MCG/SPRAY nasal spray Place 2 sprays into the nose daily.   Yes [provider]  montelukast (SINGULAIR) 10 MG tablet Take 10 mg by mouth at bedtime.   Yes [provider]  Prenatal Vit-Fe Fumarate-FA (PRENATAL MULTIVITAMIN) TABS tablet Take 1 tablet by mouth daily at 12 noon.   Yes [provider]  pyridoxine (B-6) 100 MG tablet Take 100 mg by mouth daily.   Yes [provider]     Social History: She  reports that she has quit smoking. Her smoking use included cigarettes. She started smoking about 6 years ago. She has a 9.50 pack-year smoking history. She has never used smokeless tobacco. She reports previous alcohol use. She reports that she does not use drugs.  Family History: family history includes ALS in her mother; Arthritis in her maternal grandmother; Asthma in her paternal grandfather; Cancer in her paternal aunt; Emphysema in her father; Glaucoma in her paternal grandfather; Heart disease in her maternal grandfather; Lumbar disc disease in her father; Osteopenia in her paternal grandmother.   Review of Systems: A full review of systems was performed and negative except as noted in the HPI.     O:  BP 136/70 (BP Location: Left Arm)   Pulse (!) 127   Temp 99.1 F (37.3 C) (Axillary)   Resp 20   Ht 5\' 10"  (1.778 m)   Wt 93 kg   LMP 12/26/2017   SpO2 99%   BMI 29.41 kg/m  Results for orders placed or performed during the hospital encounter of 09/01/18 (from the past 48 hour(s))  CBC on admission   Collection Time: 09/01/18  5:06 PM  Result Value Ref Range   WBC 10.6 (H) 4.0 - 10.5 K/uL   RBC 3.57 (L) 3.87 - 5.11 MIL/uL  Hemoglobin 11.0 (L) 12.0 - 15.0 g/dL   HCT 16.133.4 (L) 09.636.0 - 04.546.0 %   MCV 93.6 80.0 - 100.0 fL   MCH 30.8 26.0 - 34.0 pg   MCHC 32.9 30.0 - 36.0 g/dL   RDW 40.913.1 81.111.5 - 91.415.5 %   Platelets 133 (L) 150 - 400 K/uL   nRBC 0.0 0.0 - 0.2 %     Constitutional: NAD, AAOx3, hot to the touch HE/ENT: extraocular movements grossly intact, moist mucous membranes CV: RRR PULM: normal respiratory effort, CTABL Back: diffuse mild tenderness along b/l back and b/l flanks Abd: gravid, diffuse mild  tenderness, non-distended, soft      Ext: Non-tender, Nonedmeatous   Psych: mood appropriate, speech normal Pelvic deferred  NST/monitoring:  Baseline: 175bpm Variability: moderate Accelerations: 15x15 present x >2 Decelerations: absent Toco: quiet  A/P: 38 y.o. 7735w4d here for antenatal surveillance during pregnancy.  Principle diagnosis: back pain in pregnancy  Labor  Not present  Fetal Wellbeing  Baseline initially 195bpm with moderate variability and accelerations, now 175bpm with moderate variability and accelerations. Likely responding to maternal status. Overall reassuring.   Back pain  Tenderness to palpation, without acute CVA tenderness  Tylenol PRN, K-pad PRN  Concern for maternal infection  Patient tachycardic 120s-130s, axillary temperature 99.1 and hot to the touch  CBC with WBC 10.6k, UA and COVID-19 testing pending  Elevated blood pressure  Initial blood pressure readings 140s-150s-80s-90, last two 130s/70s  CMP and P/C ratio pending, platelets 133k   Genia DelMargaret Caliya Narine 09/01/2018 5:31 PM  ----- Genia DelMargaret Jannell Franta, CNM Certified Nurse Midwife [facility], Department of OB/GYN [facility]

## 2018-09-01 NOTE — OB Triage Note (Signed)
Pt G1P0 [redacted]w[redacted]d presents to ED c/o back pain rating 4/10. Pt states she has been having lower back pain that radiates to the mid right back and side. Upon coming to L&D pt has had 2 episodes of emesis in the toilet and in the emesis bag  Pt denies leaking of fluid or vaginal bleeding. Pt states positive fetal movement. Initial FHR 190. 1st initial BP elevated, rechecked BP 133/78. M.Haviland, CNM notified of pt. Orders placed.

## 2018-09-02 ENCOUNTER — Observation Stay: Payer: BC Managed Care – PPO

## 2018-09-02 LAB — COMPREHENSIVE METABOLIC PANEL
ALT: 19 U/L (ref 0–44)
AST: 25 U/L (ref 15–41)
Albumin: 2.5 g/dL — ABNORMAL LOW (ref 3.5–5.0)
Alkaline Phosphatase: 83 U/L (ref 38–126)
Anion gap: 9 (ref 5–15)
BUN: 6 mg/dL (ref 6–20)
CO2: 18 mmol/L — ABNORMAL LOW (ref 22–32)
Calcium: 8.2 mg/dL — ABNORMAL LOW (ref 8.9–10.3)
Chloride: 109 mmol/L (ref 98–111)
Creatinine, Ser: 0.58 mg/dL (ref 0.44–1.00)
GFR calc Af Amer: >60 mL/min (ref >60)
GFR calc non Af Amer: >60 mL/min (ref >60)
Glucose, Bld: 134 mg/dL — ABNORMAL HIGH (ref 70–99)
Potassium: 3.6 mmol/L (ref 3.5–5.1)
Sodium: 136 mmol/L (ref 135–145)
Total Bilirubin: 0.5 mg/dL (ref 0.3–1.2)
Total Protein: 5.7 g/dL — ABNORMAL LOW (ref 6.5–8.1)

## 2018-09-02 LAB — DIFFERENTIAL
Basophils Absolute: 0 10*3/uL (ref 0.0–0.1)
Basophils Relative: 0 %
Eosinophils Absolute: 0 10*3/uL (ref 0.0–0.5)
Eosinophils Relative: 0 %
Lymphocytes Relative: 7 %
Lymphs Abs: 0.7 10*3/uL (ref 0.7–4.0)
Monocytes Absolute: 1.5 10*3/uL — ABNORMAL HIGH (ref 0.1–1.0)
Monocytes Relative: 14 %
Neutro Abs: 8.4 10*3/uL — ABNORMAL HIGH (ref 1.7–7.7)
Neutrophils Relative %: 78 %

## 2018-09-02 LAB — CBC
HCT: 31.3 % — ABNORMAL LOW (ref 36.0–46.0)
Hemoglobin: 9.9 g/dL — ABNORMAL LOW (ref 12.0–15.0)
MCH: 30.4 pg (ref 26.0–34.0)
MCHC: 31.6 g/dL (ref 30.0–36.0)
MCV: 96 fL (ref 80.0–100.0)
Platelets: 141 10*3/uL — ABNORMAL LOW (ref 150–400)
RBC: 3.26 MIL/uL — ABNORMAL LOW (ref 3.87–5.11)
RDW: 13.2 % (ref 11.5–15.5)
WBC: 11.1 10*3/uL — ABNORMAL HIGH (ref 4.0–10.5)
nRBC: 0 % (ref 0.0–0.2)

## 2018-09-02 LAB — LACTIC ACID, PLASMA: Lactic Acid, Venous: 1.4 mmol/L (ref 0.5–1.9)

## 2018-09-02 MED ORDER — PIPERACILLIN-TAZOBACTAM 3.375 G IVPB
3.3750 g | Freq: Three times a day (TID) | INTRAVENOUS | Status: DC
  Administered 2018-09-02 (×2): 3.375 g via INTRAVENOUS
  Filled 2018-09-02 (×4): qty 50

## 2018-09-02 MED ORDER — ACETAMINOPHEN 500 MG PO TABS
1000.0000 mg | ORAL_TABLET | Freq: Four times a day (QID) | ORAL | Status: DC | PRN
  Administered 2018-09-02 – 2018-09-03 (×3): 1000 mg via ORAL
  Filled 2018-09-02 (×3): qty 2

## 2018-09-02 NOTE — Progress Notes (Signed)
Pt transferred via W/C to L&D room OBS4 for NST. Report given to [place], Therapist, sports.

## 2018-09-02 NOTE — Progress Notes (Addendum)
Summary of today, was at bedside several times throughout the day.  S:  Feeling shaking chills and varying hot/cold.  Urinating frequently, denies dysuria.  Still has right back/flank pain, and high back pain - between her shoulder blades and radiating up her neck.   This morning, her head was throbbing, she thinks from lack of caffeine, as improved with mountain dew.  Tolerating regular PO diet   Had emesis yesterday upon arrival but none since.  She denies change of vision, RUQ pain, difficulty breathing.  She also denies change in cognition, weakness, change in gait.  Yesterday 09/01/18:  Hypertensive upon admission with tachycardia and elevated PC ratio.  After hydration BPs returned to normal, occasionally low.  Met SIRS criteria upon admission.  On NST, fetal hearts were tachycardic with accels, and return to normal baseline after maternal improvement.  Given 1 dose ceftriaxone.  UA concerning for infection, Ucx pending.  No leukocytosis.  O:  Patient Vitals for the past 24 hrs:  BP Temp Temp src Pulse Resp SpO2  09/02/18 2106 129/60 (!) 100.8 F (38.2 C) Oral (!) 124 - 99 %  09/02/18 1538 111/76 98.6 F (37 C) Oral 88 18 99 %  09/02/18 1500 - 99.3 F (37.4 C) Oral - - -  09/02/18 1315 - 98.9 F (37.2 C) Oral - - -  09/02/18 1140 129/72 (!) 102.3 F (39.1 C) Oral (!) 126 (!) 22 100 %  09/02/18 0842 111/60 98.9 F (37.2 C) - 85 20 99 %  09/02/18 0623 (!) 95/52 98.2 F (36.8 C) Oral (!) 110 18 98 %  09/02/18 0245 (!) 110/52 98.6 F (37 C) Oral (!) 120 20 97 %   GEN: no acute distress, but ill appearing.   Resp: no increased work of breathing, lungs clear bilaterally on auscultation CV: regular rhythm, tachycardic, no rubs/gallops/murmurs, +pedal pulses, 1+ bilateral LE edema Abd: non-tender. +bowel sounds, no radiating pain from right, non-tender gravid uterus. LEs:  No cords, warmth, erythema or tenderness Back: no spinal tenderness in low back.  +CVA and flank tenderness on  RIGHT, no tenderness on left.  Upper back, between shoulder blades and up to cervical spine is tender, no deformation,  Neuro:  No decreased range of motion of head and neck, negative signs for meningitis.  Results for orders placed or performed during the hospital encounter of 09/01/18 (from the past 24 hour(s))  CBC     Status: Abnormal   Collection Time: 09/02/18  6:46 AM  Result Value Ref Range   WBC 11.1 (H) 4.0 - 10.5 K/uL   RBC 3.26 (L) 3.87 - 5.11 MIL/uL   Hemoglobin 9.9 (L) 12.0 - 15.0 g/dL   HCT 31.3 (L) 36.0 - 46.0 %   MCV 96.0 80.0 - 100.0 fL   MCH 30.4 26.0 - 34.0 pg   MCHC 31.6 30.0 - 36.0 g/dL   RDW 13.2 11.5 - 15.5 %   Platelets 141 (L) 150 - 400 K/uL   nRBC 0.0 0.0 - 0.2 %  Differential     Status: Abnormal   Collection Time: 09/02/18  6:46 AM  Result Value Ref Range   Neutrophils Relative % 78 %   Neutro Abs 8.4 (H) 1.7 - 7.7 K/uL   Lymphocytes Relative 7 %   Lymphs Abs 0.7 0.7 - 4.0 K/uL   Monocytes Relative 14 %   Monocytes Absolute 1.5 (H) 0.1 - 1.0 K/uL   Eosinophils Relative 0 %   Eosinophils Absolute 0.0 0.0 - 0.5  K/uL   Basophils Relative 0 %   Basophils Absolute 0.0 0.0 - 0.1 K/uL  Comprehensive metabolic panel     Status: Abnormal   Collection Time: 09/02/18 12:22 PM  Result Value Ref Range   Sodium 136 135 - 145 mmol/L   Potassium 3.6 3.5 - 5.1 mmol/L   Chloride 109 98 - 111 mmol/L   CO2 18 (L) 22 - 32 mmol/L   Glucose, Bld 134 (H) 70 - 99 mg/dL   BUN 6 6 - 20 mg/dL   Creatinine, Ser 0.58 0.44 - 1.00 mg/dL   Calcium 8.2 (L) 8.9 - 10.3 mg/dL   Total Protein 5.7 (L) 6.5 - 8.1 g/dL   Albumin 2.5 (L) 3.5 - 5.0 g/dL   AST 25 15 - 41 U/L   ALT 19 0 - 44 U/L   Alkaline Phosphatase 83 38 - 126 U/L   Total Bilirubin 0.5 0.3 - 1.2 mg/dL   GFR calc non Af Amer >60 >60 mL/min   GFR calc Af Amer >60 >60 mL/min   Anion gap 9 5 - 15  Lactic acid, plasma     Status: None   Collection Time: 09/02/18  3:56 PM  Result Value Ref Range   Lactic Acid, Venous  1.4 0.5 - 1.9 mmol/L   NST Baseline: 150 Variability: moderate Accelerations present x >2 Decelerations absent Time 75mns  Bedside [place] breech confirmed.  [place]  Result Date: 09/02/2018 CLINICAL DATA:  Right flank pain.  Third trimester gestation EXAM: RENAL / URINARY TRACT ULTRASOUND COMPLETE COMPARISON:  None. FINDINGS: Right Kidney: Renal measurements: 12.9 x 6.4 x 7.1 cm = volume: 310 mL . Echogenicity and renal cortical thickness are within normal limits. No mass or perinephric fluid visualized. There is mild pelviectasis without calyceal dilatation. No sonographically demonstrable calculus or ureterectasis evident. Left Kidney: Renal measurements: 13.7 x 5.6 x 6.1 cm = volume: 247 mL. Echogenicity and renal cortical thickness within normal limits. No mass or perinephric fluid visualized. There is slight fullness of the left renal pelvis without caliectasis. Bladder: Appears normal for degree of bladder distention. IMPRESSION: Mild fullness of each renal pelvis, slightly more on the right than on the left. No ureteral or caliceal dilatation evident. No obstructing focus evident on either side. Study otherwise unremarkable. Electronically Signed   By: WLowella GripIII M.D.   On: 09/02/2018 13:29   A/P: [id number] @ 35+5 with SIRS and presumed ascending  urinary tract infection/ pyelonephritis.  1. 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  Report shows sensitive to pip-tazo, which was started prior to report for broad coverage.  Urine culture pending. 2. Blood cultures and lactate collected and pending 3. Renal ultrasound shows no hydronephrosis, so pain is likely infection related.   4. No evidence of meningitis. 5. Tylenol PRN for fever and pain control.   6. Shift fetal hearts 7. Pelvic exam deferred 8. Continue IV fluids and PO hydration/nutrition 9. Discharge considered onces >24hrs afebrile.    10. NST:  Interpretation: reactive NST, category 1 tracing    Continue close monitoring.  ----- CLarey Days MD, FCass LakeAttending Obstetrician and Gynecologist [facility] Department of [facility]

## 2018-09-02 NOTE — Progress Notes (Signed)
Pt's temp rechecked, 98.9. Pt states she is feeling better, ordered lunch tray. Updated on plan of care, Dr. Leonides Schanz rounded with patient.

## 2018-09-02 NOTE — Progress Notes (Signed)
Pt with fever of 102.3. Dr. Leonides Schanz notified. Tylenol was given at 1115. Additional orders to follow per Dr. Leonides Schanz.

## 2018-09-02 NOTE — Progress Notes (Signed)
Pt called for warm blanket. Temp rechecked, 99.3, pt noted beginning to shiver. Dr. Leonides Schanz notified, tylenol given per MD request.

## 2018-09-02 NOTE — Progress Notes (Signed)
Notified by Infection control to initiate contact precautions for pt's history of e. Coli with probable ESBL in urine from January 2020. Results from [place] pulled by [facility].

## 2018-09-02 NOTE — Progress Notes (Signed)
Pt returned to room at approx. 1115, complains of back pain, R flank pain, and chills.

## 2018-09-03 ENCOUNTER — Observation Stay: Payer: BC Managed Care – PPO

## 2018-09-03 DIAGNOSIS — M549 Dorsalgia, unspecified: Secondary | ICD-10-CM | POA: Diagnosis present

## 2018-09-03 DIAGNOSIS — N390 Urinary tract infection, site not specified: Secondary | ICD-10-CM | POA: Diagnosis not present

## 2018-09-03 DIAGNOSIS — R7881 Bacteremia: Secondary | ICD-10-CM | POA: Diagnosis not present

## 2018-09-03 DIAGNOSIS — Z87891 Personal history of nicotine dependence: Secondary | ICD-10-CM | POA: Diagnosis not present

## 2018-09-03 DIAGNOSIS — R6510 Systemic inflammatory response syndrome (SIRS) of non-infectious origin without acute organ dysfunction: Secondary | ICD-10-CM

## 2018-09-03 DIAGNOSIS — D649 Anemia, unspecified: Secondary | ICD-10-CM | POA: Diagnosis present

## 2018-09-03 DIAGNOSIS — R030 Elevated blood-pressure reading, without diagnosis of hypertension: Secondary | ICD-10-CM | POA: Diagnosis present

## 2018-09-03 DIAGNOSIS — Z3A35 35 weeks gestation of pregnancy: Secondary | ICD-10-CM | POA: Diagnosis not present

## 2018-09-03 DIAGNOSIS — O26893 Other specified pregnancy related conditions, third trimester: Secondary | ICD-10-CM | POA: Diagnosis present

## 2018-09-03 DIAGNOSIS — B9620 Unspecified Escherichia coli [E. coli] as the cause of diseases classified elsewhere: Secondary | ICD-10-CM | POA: Diagnosis present

## 2018-09-03 DIAGNOSIS — D696 Thrombocytopenia, unspecified: Secondary | ICD-10-CM | POA: Diagnosis present

## 2018-09-03 DIAGNOSIS — O2343 Unspecified infection of urinary tract in pregnancy, third trimester: Secondary | ICD-10-CM | POA: Diagnosis present

## 2018-09-03 DIAGNOSIS — Z1612 Extended spectrum beta lactamase (ESBL) resistance: Secondary | ICD-10-CM | POA: Diagnosis not present

## 2018-09-03 DIAGNOSIS — O99013 Anemia complicating pregnancy, third trimester: Secondary | ICD-10-CM | POA: Diagnosis present

## 2018-09-03 DIAGNOSIS — O99113 Other diseases of the blood and blood-forming organs and certain disorders involving the immune mechanism complicating pregnancy, third trimester: Secondary | ICD-10-CM | POA: Diagnosis present

## 2018-09-03 DIAGNOSIS — A4151 Sepsis due to Escherichia coli [E. coli]: Secondary | ICD-10-CM | POA: Diagnosis present

## 2018-09-03 DIAGNOSIS — Z20828 Contact with and (suspected) exposure to other viral communicable diseases: Secondary | ICD-10-CM | POA: Diagnosis present

## 2018-09-03 LAB — BASIC METABOLIC PANEL
Anion gap: 9 (ref 5–15)
Anion gap: 7 (ref 5–15)
BUN: 7 mg/dL (ref 6–20)
BUN: 6 mg/dL (ref 6–20)
CO2: 18 mmol/L — ABNORMAL LOW (ref 22–32)
CO2: 21 mmol/L — ABNORMAL LOW (ref 22–32)
Calcium: 8.4 mg/dL — ABNORMAL LOW (ref 8.9–10.3)
Calcium: 8.3 mg/dL — ABNORMAL LOW (ref 8.9–10.3)
Chloride: 108 mmol/L (ref 98–111)
Chloride: 108 mmol/L (ref 98–111)
Creatinine, Ser: 0.65 mg/dL (ref 0.44–1.00)
Creatinine, Ser: 0.58 mg/dL (ref 0.44–1.00)
GFR calc Af Amer: >60 mL/min (ref >60)
GFR calc Af Amer: >60 mL/min (ref >60)
GFR calc non Af Amer: >60 mL/min (ref >60)
GFR calc non Af Amer: >60 mL/min (ref >60)
Glucose, Bld: 139 mg/dL — ABNORMAL HIGH (ref 70–99)
Glucose, Bld: 115 mg/dL — ABNORMAL HIGH (ref 70–99)
Potassium: 2.8 mmol/L — ABNORMAL LOW (ref 3.5–5.1)
Potassium: 3.6 mmol/L (ref 3.5–5.1)
Sodium: 135 mmol/L (ref 135–145)
Sodium: 136 mmol/L (ref 135–145)

## 2018-09-03 LAB — RESPIRATORY PANEL BY PCR

## 2018-09-03 LAB — HEPATIC FUNCTION PANEL
ALT: 21 U/L (ref 0–44)
AST: 26 U/L (ref 15–41)
Albumin: 2.3 g/dL — ABNORMAL LOW (ref 3.5–5.0)
Alkaline Phosphatase: 86 U/L (ref 38–126)
Bilirubin, Direct: 0.2 mg/dL (ref 0.0–0.2)
Indirect Bilirubin: 0.4 mg/dL (ref 0.3–0.9)
Total Bilirubin: 0.6 mg/dL (ref 0.3–1.2)
Total Protein: 5.6 g/dL — ABNORMAL LOW (ref 6.5–8.1)

## 2018-09-03 LAB — CBC WITH DIFFERENTIAL/PLATELET
Abs Immature Granulocytes: 0.22 10*3/uL — ABNORMAL HIGH (ref 0.00–0.07)
Basophils Absolute: 0 10*3/uL (ref 0.0–0.1)
Basophils Relative: 0 %
Eosinophils Absolute: 0 10*3/uL (ref 0.0–0.5)
Eosinophils Relative: 0 %
HCT: 30.7 % — ABNORMAL LOW (ref 36.0–46.0)
Hemoglobin: 9.9 g/dL — ABNORMAL LOW (ref 12.0–15.0)
Immature Granulocytes: 2 %
Lymphocytes Relative: 4 %
Lymphs Abs: 0.4 10*3/uL — ABNORMAL LOW (ref 0.7–4.0)
MCH: 30.9 pg (ref 26.0–34.0)
MCHC: 32.2 g/dL (ref 30.0–36.0)
MCV: 95.9 fL (ref 80.0–100.0)
Monocytes Absolute: 1.5 10*3/uL — ABNORMAL HIGH (ref 0.1–1.0)
Monocytes Relative: 13 %
Neutro Abs: 9.4 10*3/uL — ABNORMAL HIGH (ref 1.7–7.7)
Neutrophils Relative %: 81 %
Platelets: 130 10*3/uL — ABNORMAL LOW (ref 150–400)
RBC: 3.2 MIL/uL — ABNORMAL LOW (ref 3.87–5.11)
RDW: 13.2 % (ref 11.5–15.5)
WBC: 11.6 10*3/uL — ABNORMAL HIGH (ref 4.0–10.5)
nRBC: 0 % (ref 0.0–0.2)

## 2018-09-03 LAB — BLOOD CULTURE ID PANEL (REFLEXED)

## 2018-09-03 LAB — TSH: TSH: 0.972 u[IU]/mL (ref 0.350–4.500)

## 2018-09-03 LAB — SARS CORONAVIRUS 2 BY RT PCR (HOSPITAL ORDER, PERFORMED IN ~~LOC~~ HOSPITAL LAB): SARS Coronavirus 2: NEGATIVE

## 2018-09-03 LAB — OSMOLALITY, URINE: Osmolality, Ur: 321 mOsm/kg (ref 300–900)

## 2018-09-03 MED ORDER — ACETAMINOPHEN 325 MG PO TABS
650.0000 mg | ORAL_TABLET | ORAL | Status: DC | PRN
  Administered 2018-09-03 – 2018-09-06 (×6): 650 mg via ORAL
  Filled 2018-09-03 (×6): qty 2

## 2018-09-03 MED ORDER — SODIUM CHLORIDE 0.9 % IV SOLN
1.0000 g | Freq: Three times a day (TID) | INTRAVENOUS | Status: AC
  Administered 2018-09-03 – 2018-09-05 (×9): 1 g via INTRAVENOUS
  Filled 2018-09-03 (×11): qty 1

## 2018-09-03 MED ORDER — POTASSIUM CHLORIDE CRYS ER 10 MEQ PO TBCR
40.0000 meq | EXTENDED_RELEASE_TABLET | Freq: Three times a day (TID) | ORAL | Status: DC
  Administered 2018-09-03 – 2018-09-06 (×12): 40 meq via ORAL
  Filled 2018-09-03 (×12): qty 4

## 2018-09-03 MED ORDER — ONDANSETRON HCL 4 MG/2ML IJ SOLN
4.0000 mg | INTRAMUSCULAR | Status: DC | PRN
  Administered 2018-09-04: 4 mg via INTRAVENOUS
  Filled 2018-09-03 (×2): qty 2

## 2018-09-03 NOTE — Progress Notes (Addendum)
Notified at 05:30 that blood culture is growing out E. Coli.   Pharmacy called and recommended change to meropenem, which is superior to pip/tazo when treating EBSL E. Coli.   Ordered.  Changing patient from observation to inpatient due to Sepsis from e.coli bacteremia, and anticipated extended stay is beyond 2 midnights.  ----- Larey Days, MD, [place] Attending Obstetrician and Gynecologist [facility], Department of [facility]

## 2018-09-03 NOTE — Progress Notes (Addendum)
Pt taken to L&D at 0541 for NST.

## 2018-09-03 NOTE — Progress Notes (Signed)
PHARMACY - PHYSICIAN COMMUNICATION CRITICAL VALUE ALERT - BLOOD CULTURE IDENTIFICATION (BCID)  Mariah [name] is an 38 y.o. female who presented to [facility] on 09/01/2018 with a chief complaint of Fever, chills, N/A  Assessment:  BCx 1 of 4 GNR. BCID positive for E. Coli Patient has had previous ESBL E.Coli infection   Name of physician (or Provider) Contacted: Dr. Leonides Schanz  Current antibiotics: Zosyn  Changes to prescribed antibiotics recommended:  Patient will be started on Meropenem   Results for orders placed or performed during the hospital encounter of 09/01/18  Blood Culture ID Panel (Reflexed) (Collected: 09/02/2018 12:22 PM)  Result Value Ref Range   Enterococcus species NOT DETECTED NOT DETECTED   Listeria monocytogenes NOT DETECTED NOT DETECTED   Staphylococcus species NOT DETECTED NOT DETECTED   Staphylococcus aureus (BCID) NOT DETECTED NOT DETECTED   Streptococcus species NOT DETECTED NOT DETECTED   Streptococcus agalactiae NOT DETECTED NOT DETECTED   Streptococcus pneumoniae NOT DETECTED NOT DETECTED   Streptococcus pyogenes NOT DETECTED NOT DETECTED   Acinetobacter baumannii NOT DETECTED NOT DETECTED   Enterobacteriaceae species DETECTED (A) NOT DETECTED   Enterobacter cloacae complex NOT DETECTED NOT DETECTED   Escherichia coli DETECTED (A) NOT DETECTED   Klebsiella oxytoca NOT DETECTED NOT DETECTED   Klebsiella pneumoniae NOT DETECTED NOT DETECTED   Proteus species NOT DETECTED NOT DETECTED   Serratia marcescens NOT DETECTED NOT DETECTED   Carbapenem resistance NOT DETECTED NOT DETECTED   Haemophilus influenzae NOT DETECTED NOT DETECTED   Neisseria meningitidis NOT DETECTED NOT DETECTED   Pseudomonas aeruginosa NOT DETECTED NOT DETECTED   Candida albicans NOT DETECTED NOT DETECTED   Candida glabrata NOT DETECTED NOT DETECTED   Candida krusei NOT DETECTED NOT DETECTED   Candida parapsilosis NOT DETECTED NOT DETECTED   Candida tropicalis NOT DETECTED NOT  DETECTED    Pernell Dupre, PharmD, BCPS Clinical Pharmacist 09/03/2018 6:09 AM

## 2018-09-03 NOTE — Progress Notes (Signed)
NST reactive, Dr.Ward informed of reactive strip. Pt taken back to [facility].

## 2018-09-03 NOTE — Progress Notes (Addendum)
HD 3  38yo G1P0 @ 35+6w  S:   Continues to have intermittent shaking chills and varying hot/cold.  Urinating frequently and abundantly, denies dysuria.  States she is always thirsty.  Still has right back/flank pain,  High back pain has resolved.   Tolerating regular PO diet, but has spells of nausea and vomiting.   She is concerned about the health of her baby through this.  She denies change of vision, RUQ pain, difficulty breathing.  She also denies change in cognition, weakness, change in gait.  This morning blood culture x1 grew e.coli.  Pharmacy recommended change to meropenem, which was ordered.  Labs are stable.  She continues to have shaking chills just before a fever spike; fever curve trending down.     Yesterday 09/02/18: Tmax to 102, drew blood cultures and changed ceftriaxone to pip/tazo q8h.  renal ultrasound showed no hydronephrosis.  Bacterial sensitivities to previous EBSL e.coli showed sensitivites to carbapenems and pip/tazo, so continued pip/tazo.  At night was febrile to 100.8,  developed dry cough, retested for covid, respiratory panel, put on droplet precautions.  Covid again negative.  Respiratory panel negative. NST qshift reactive.    Day of admission: 09/01/18:  Hypertensive upon admission with tachycardia and elevated PC ratio.  After hydration BPs returned to normal, occasionally low.  Met SIRS criteria upon admission.  On NST, fetal hearts were tachycardic with accels, and return to normal baseline after maternal improvement.  Given 1 dose ceftriaxone.  UA concerning for infection, Ucx pending.  No leukocytosis.  O:  Patient Vitals for the past 24 hrs:  BP Temp Temp src Pulse Resp SpO2  09/03/18 0944 - - Oral 90 18 100 %  09/03/18 0634 - 98 F (36.7 C) Oral - - -  09/03/18 0447 - (!) 100.6 F (38.1 C) - (!) 126 - 96 %  09/03/18 0411 127/73 99.5 F (37.5 C) Oral (!) 116 20 98 %  09/03/18 0300 - 99.2 F (37.3 C) Oral - - -  09/03/18 0028 109/62 98.2 F (36.8  C) Oral (!) 113 20 96 %  09/02/18 2106 129/60 (!) 100.8 F (38.2 C) Oral (!) 124 - 99 %  09/02/18 1538 111/76 98.6 F (37 C) Oral 88 18 99 %  09/02/18 1500 - 99.3 F (37.4 C) Oral - - -  09/02/18 1315 - 98.9 F (37.2 C) Oral - - -  09/02/18 1140 129/72 (!) 102.3 F (39.1 C) Oral (!) 126 (!) 22 100 %    Intake/Output Summary (Last 24 hours) at 09/03/2018 1131 Last data filed at 09/03/2018 0721 Gross per 24 hour  Intake 2056.9 ml  Output 3100 ml  Net -1043.1 ml  625cc crystalloid IV, and PO intake not recorded since 8am. 600cc measured out not accounted for, as well as one urination not recorded.   GEN: no acute distress.   Resp: no increased work of breathing, lungs clear bilaterally on auscultation, diminished at bases, with improvement during incentive spirometry. CV: regular rhythm, tachycardic, no rubs/gallops/murmurs, +pedal pulses, 1+ bilateral LE edema Abd: non-tender. +bowel sounds, no radiating pain from right, non-tender gravid uterus. LEs:  No cords, warmth, erythema or tenderness Back: +tenderness in low back.  +CVA and flank tenderness on RIGHT, no tenderness on left.   Neuro:  No decreased range of motion of head and neck, negative signs for meningitis.  Results for orders placed or performed during the hospital encounter of 09/01/18 (from the past 24 hour(s))  Comprehensive metabolic panel  Status: Abnormal   Collection Time: 09/02/18 12:22 PM  Result Value Ref Range   Sodium 136 135 - 145 mmol/L   Potassium 3.6 3.5 - 5.1 mmol/L   Chloride 109 98 - 111 mmol/L   CO2 18 (L) 22 - 32 mmol/L   Glucose, Bld 134 (H) 70 - 99 mg/dL   BUN 6 6 - 20 mg/dL   Creatinine, Ser 0.58 0.44 - 1.00 mg/dL   Calcium 8.2 (L) 8.9 - 10.3 mg/dL   Total Protein 5.7 (L) 6.5 - 8.1 g/dL   Albumin 2.5 (L) 3.5 - 5.0 g/dL   AST 25 15 - 41 U/L   ALT 19 0 - 44 U/L   Alkaline Phosphatase 83 38 - 126 U/L   Total Bilirubin 0.5 0.3 - 1.2 mg/dL   GFR calc non Af Amer >60 >60 mL/min   GFR  calc Af Amer >60 >60 mL/min   Anion gap 9 5 - 15  CULTURE, BLOOD (ROUTINE X 2) w Reflex to ID Panel     Status: None (Preliminary result)   Collection Time: 09/02/18 12:22 PM   Specimen: BLOOD  Result Value Ref Range   Specimen Description BLOOD RIGHT ANTECUBITAL    Special Requests      BOTTLES DRAWN AEROBIC AND ANAEROBIC Blood Culture results may not be optimal due to an excessive volume of blood received in culture bottles   Culture  Setup Time      Organism ID to follow [facility] TO, READ BACK BY AND VERIFIED WITH: [facility] HALLAJI AT [id number] 09/03/18 [place] Performed at [facility], [address] [place], [place] [address]    Culture GRAM NEGATIVE RODS    Report Status PENDING   Blood Culture ID Panel (Reflexed)     Status: Abnormal   Collection Time: 09/02/18 12:22 PM  Result Value Ref Range   Enterococcus species NOT DETECTED NOT DETECTED   Listeria monocytogenes NOT DETECTED NOT DETECTED   Staphylococcus species NOT DETECTED NOT DETECTED   Staphylococcus aureus (BCID) NOT DETECTED NOT DETECTED   Streptococcus species NOT DETECTED NOT DETECTED   Streptococcus agalactiae NOT DETECTED NOT DETECTED   Streptococcus pneumoniae NOT DETECTED NOT DETECTED   Streptococcus pyogenes NOT DETECTED NOT DETECTED   Acinetobacter baumannii NOT DETECTED NOT DETECTED   Enterobacteriaceae species DETECTED (A) NOT DETECTED   Enterobacter cloacae complex NOT DETECTED NOT DETECTED   Escherichia coli DETECTED (A) NOT DETECTED   Klebsiella oxytoca NOT DETECTED NOT DETECTED   Klebsiella pneumoniae NOT DETECTED NOT DETECTED   Proteus species NOT DETECTED NOT DETECTED   Serratia marcescens NOT DETECTED NOT DETECTED   Carbapenem resistance NOT DETECTED NOT DETECTED   Haemophilus influenzae NOT DETECTED NOT DETECTED   Neisseria meningitidis NOT DETECTED NOT DETECTED   Pseudomonas aeruginosa NOT DETECTED NOT DETECTED   Candida albicans NOT  DETECTED NOT DETECTED   Candida glabrata NOT DETECTED NOT DETECTED   Candida krusei NOT DETECTED NOT DETECTED   Candida parapsilosis NOT DETECTED NOT DETECTED   Candida tropicalis NOT DETECTED NOT DETECTED  CULTURE, BLOOD (ROUTINE X 2) w Reflex to ID Panel     Status: None (Preliminary result)   Collection Time: 09/02/18 12:31 PM   Specimen: BLOOD  Result Value Ref Range   Specimen Description BLOOD BLOOD RIGHT HAND    Special Requests      BOTTLES DRAWN AEROBIC AND ANAEROBIC Blood Culture adequate volume   Culture      NO  GROWTH < 24 HOURS Performed at [facility], [address] [place], [place] [address]    Report Status PENDING   Lactic acid, plasma     Status: None   Collection Time: 09/02/18  3:56 PM  Result Value Ref Range   Lactic Acid, Venous 1.4 0.5 - 1.9 mmol/L  Respiratory Panel by PCR     Status: None   Collection Time: 09/03/18  3:58 AM  Result Value Ref Range   Adenovirus NOT DETECTED NOT DETECTED   Coronavirus 229E NOT DETECTED NOT DETECTED   Coronavirus HKU1 NOT DETECTED NOT DETECTED   Coronavirus NL63 NOT DETECTED NOT DETECTED   Coronavirus OC43 NOT DETECTED NOT DETECTED   Metapneumovirus NOT DETECTED NOT DETECTED   Rhinovirus / Enterovirus NOT DETECTED NOT DETECTED   Influenza A NOT DETECTED NOT DETECTED   Influenza B NOT DETECTED NOT DETECTED   Parainfluenza Virus 1 NOT DETECTED NOT DETECTED   Parainfluenza Virus 2 NOT DETECTED NOT DETECTED   Parainfluenza Virus 3 NOT DETECTED NOT DETECTED   Parainfluenza Virus 4 NOT DETECTED NOT DETECTED   Respiratory Syncytial Virus NOT DETECTED NOT DETECTED   Bordetella pertussis NOT DETECTED NOT DETECTED   Chlamydophila pneumoniae NOT DETECTED NOT DETECTED   Mycoplasma pneumoniae NOT DETECTED NOT DETECTED  SARS Coronavirus 2 (CEPHEID - Performed in [place] hospital lab), Hosp Order     Status: None   Collection Time: 09/03/18  3:59 AM   Specimen: Nasopharyngeal Swab  Result Value Ref Range   SARS  Coronavirus 2 NEGATIVE NEGATIVE  CBC with Differential/Platelet     Status: Abnormal   Collection Time: 09/03/18  6:30 AM  Result Value Ref Range   WBC 11.6 (H) 4.0 - 10.5 K/uL   RBC 3.20 (L) 3.87 - 5.11 MIL/uL   Hemoglobin 9.9 (L) 12.0 - 15.0 g/dL   HCT 30.7 (L) 36.0 - 46.0 %   MCV 95.9 80.0 - 100.0 fL   MCH 30.9 26.0 - 34.0 pg   MCHC 32.2 30.0 - 36.0 g/dL   RDW 13.2 11.5 - 15.5 %   Platelets 130 (L) 150 - 400 K/uL   nRBC 0.0 0.0 - 0.2 %   Neutrophils Relative % 81 %   Neutro Abs 9.4 (H) 1.7 - 7.7 K/uL   Lymphocytes Relative 4 %   Lymphs Abs 0.4 (L) 0.7 - 4.0 K/uL   Monocytes Relative 13 %   Monocytes Absolute 1.5 (H) 0.1 - 1.0 K/uL   Eosinophils Relative 0 %   Eosinophils Absolute 0.0 0.0 - 0.5 K/uL   Basophils Relative 0 %   Basophils Absolute 0.0 0.0 - 0.1 K/uL   Immature Granulocytes 2 %   Abs Immature Granulocytes 0.22 (H) 0.00 - 0.07 K/uL  Basic metabolic panel     Status: Abnormal   Collection Time: 09/03/18  6:30 AM  Result Value Ref Range   Sodium 135 135 - 145 mmol/L   Potassium 2.8 (L) 3.5 - 5.1 mmol/L   Chloride 108 98 - 111 mmol/L   CO2 18 (L) 22 - 32 mmol/L   Glucose, Bld 139 (H) 70 - 99 mg/dL   BUN 7 6 - 20 mg/dL   Creatinine, Ser 0.65 0.44 - 1.00 mg/dL   Calcium 8.4 (L) 8.9 - 10.3 mg/dL   GFR calc non Af Amer >60 >60 mL/min   GFR calc Af Amer >60 >60 mL/min   Anion gap 9 5 - 15  Hepatic function panel     Status: Abnormal  Collection Time: 09/03/18  6:30 AM  Result Value Ref Range   Total Protein 5.6 (L) 6.5 - 8.1 g/dL   Albumin 2.3 (L) 3.5 - 5.0 g/dL   AST 26 15 - 41 U/L   ALT 21 0 - 44 U/L   Alkaline Phosphatase 86 38 - 126 U/L   Total Bilirubin 0.6 0.3 - 1.2 mg/dL   Bilirubin, Direct 0.2 0.0 - 0.2 mg/dL   Indirect Bilirubin 0.4 0.3 - 0.9 mg/dL   NST  Evening 09/02/18 Baseline: 150 Variability: moderate Accelerations present x >2 Decelerations absent Time 27mns  NST  Early AM 09/03/18 Baseline: 150 Variability: moderate Accelerations  present x >2 Decelerations absent Time 279ms  Bedside USKoreabreech confirmed.  UsKoreaenal  Result Date: 09/02/2018 CLINICAL DATA:  Right flank pain.  Third trimester gestation EXAM: RENAL / URINARY TRACT ULTRASOUND COMPLETE COMPARISON:  None. FINDINGS: Right Kidney: Renal measurements: 12.9 x 6.4 x 7.1 cm = volume: 310 mL . Echogenicity and renal cortical thickness are within normal limits. No mass or perinephric fluid visualized. There is mild pelviectasis without calyceal dilatation. No sonographically demonstrable calculus or ureterectasis evident. Left Kidney: Renal measurements: 13.7 x 5.6 x 6.1 cm = volume: 247 mL. Echogenicity and renal cortical thickness within normal limits. No mass or perinephric fluid visualized. There is slight fullness of the left renal pelvis without caliectasis. Bladder: Appears normal for degree of bladder distention. IMPRESSION: Mild fullness of each renal pelvis, slightly more on the right than on the left. No ureteral or caliceal dilatation evident. No obstructing focus evident on either side. Study otherwise unremarkable. Electronically Signed   By: WiLowella GripII M.D.   On: 09/02/2018 13:29   Dg Chest Port 1 View  Result Date: 09/03/2018 CLINICAL DATA:  Cough with chills. EXAM: PORTABLE CHEST 1 VIEW COMPARISON:  None. FINDINGS: Cardiac silhouette is normal in size and configuration. No mediastinal or hilar masses. There is no evidence of adenopathy. There are prominent bronchovascular markings and mild interstitial thickening most evident in the lower lungs. Lungs are otherwise clear. No pleural effusion or pneumothorax. Skeletal structures are grossly intact. IMPRESSION: 1. Prominent bronchovascular and interstitial markings in the lower lungs. Possible acute interstitial infection/inflammation. Electronically Signed   By: DaLajean Manes.D.   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   1. Bacteremia:  Pip/tazo changed to meropenem q8h.  Pharmacy consulted.  ID consulted, will see patient later today.  2. Respiratory panel negative, covid negative. 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  Tricky situation here with need for fluid administration during sepsis, but need for fluid restriction due to DI. Would like to test via restrict water intake to try to achieve low osm and slight hypernatriemia in order to determine origin of DI.  Hypokalemia - replacing with TID PO K-dur. 4039m Will recheck BMP 8pm. 4. No evidence of meningitis. 5. Tylenol PRN for fever and pain control.   6. Shift fetal NST 7. Pelvic exam deferred 8. Continue PO hydration/nutrition 9. Foam roller for back pain.  Stretching.      10. NST: Interpretation: reactive NST, category 1 tracing, for both NSTs performed and documented above.  Will change obs to inpatient due to the complexity of her medical care and condition.    Continue close monitoring.  ----- CheLarey DaysD, FACPremier Physicians Centers Inctending Obstetrician and Gynecologist  [facility], Department of [facility]

## 2018-09-03 NOTE — Progress Notes (Signed)
NST @ 1700  Patient's last menstrual period was 12/26/2017. Estimated Date of Delivery: 10/02/18  Baseline: 140 Variability: moderate Accelerations present x >2 Decelerations absent Time 52mins  Interpretation: reactive NST, category 1 tracing  ----- Larey Days, MD Attending Obstetrician and Gynecologist [facility], Department of [facility]

## 2018-09-03 NOTE — Consult Note (Signed)
Pharmacy Antibiotic Note  Mariah [name] is a 38 y.o. female admitted on 09/01/2018 with urinary frequency, chills, and fever. BCx 1 of 4 GNR. BCID positive for E. Coli Patient has had previous ESBL E.Coli infection.  Pharmacy has been consulted for meropenem dosing.  Plan: Start meropenem 1g IV every 8 hours   Height: 5\' 10"  (177.8 cm) Weight: 205 lb (93 kg) IBW/kg (Calculated) : 68.5  Temp (24hrs), Avg:99.6 F (37.6 C), Min:98.2 F (36.8 C), Max:102.3 F (39.1 C)  Recent Labs  Lab 09/01/18 1706 09/02/18 0646 09/02/18 1222 09/02/18 1556  WBC 10.6* 11.1*  --   --   CREATININE 0.55  --  0.58  --   LATICACIDVEN  --   --   --  1.4    Estimated Creatinine Clearance: 115.5 mL/min (by C-G formula based on SCr of 0.58 mg/dL).    No Known Allergies  Antimicrobials this admission: 7/11 Zosyn  >> 7/12 7/12 meropenem >>   Microbiology results: 7/11 BCx: 1 of 4 GNR 7/11 BCID: E. Coli 7/11 UCx: pending  Thank you for allowing pharmacy to be a part of this patient's care.  Pernell Dupre, PharmD, BCPS Clinical Pharmacist 09/03/2018 6:25 AM

## 2018-09-03 NOTE — Progress Notes (Signed)
Patient has developed a non-productive cough.   She is still having chills, intermittently  Covid testing was negative yesterday.  -chest xray -respiratory panel -repeat covid testing -airborne / droplet precautions  ----- Larey Days, MD, FACOG Attending Obstetrician and Gynecologist [facility], Department of [facility]

## 2018-09-03 NOTE — Progress Notes (Addendum)
Summary of day:  Tmax 101.6 @ 1520  Did not have shaking chills before/after this, which is an improvement per patient.  Patient Vitals for the past 24 hrs:  BP Temp Temp src Pulse Resp SpO2  09/03/18 2011 130/68 98.6 F (37 C) Oral (!) 122 - 100 %  09/03/18 1616 112/74 98.4 F (36.9 C) Oral 82 18 99 %  09/03/18 1520 108/68 (!) 101.6 F (38.7 C) Oral (!) 104 (!) 22 99 %  09/03/18 1439 110/70 (!) 101.1 F (38.4 C) Oral 90 20 99 %  09/03/18 0944 - - Oral 90 18 100 %  09/03/18 0634 - 98 F (36.7 C) Oral - - -  09/03/18 0447 - (!) 100.6 F (38.1 C) - (!) 126 - 96 %  09/03/18 0411 127/73 99.5 F (37.5 C) Oral (!) 116 20 98 %  09/03/18 0300 - 99.2 F (37.3 C) Oral - - -  09/03/18 0028 109/62 98.2 F (36.8 C) Oral (!) 113 20 96 %     Intake/Output Summary (Last 24 hours) at 09/03/2018 2104 Last data filed at 09/03/2018 2012 Gross per 24 hour  Intake 1978.23 ml  Output 3100 ml  Net -1121.77 ml       E. Coli isolated in urine >100k colonies E. Coli isolated in blood  Na+ at 136  K+ improved to 3.6 with PO kdur 40mg  x2 Uosm normal at 321  Antibiotics since admission: Ceftriaxone 1g x1 Pip/tazo 3.375g x 4 Meropenem 1g x 2  Assessment: 38yo G1P0 @ 35+6 with  -sepsis from e. Coli bacteremia and bacturia.   -diabetes insipidus  -hypokalemia -hyperglycemia -thrombocytopenia -anemia  Plan: Sepsis w e.coli bacteremia, bacturia:   -Meropenem q8h, per pharmacy recs -ID consulted - spoke to patient via phone today, no change in current management -continue fluid resuscitation.  -fever curve with new spike, unsure of this finding in setting of new antibiotic <24hrs in.  Will await sensitivities and ID input.  -will need longer-term antibiotics, likely d/c home when stable with PICC and home health for IV meropenem.   DI:  -urine osm is low but WNL -she is not hypernatremic.   -She is hypokalemic, with improvement with PO K+ supplementation.  Continue this while she  continues to have >3L daily -when sepsis is improving and no longer requiring fluid resuscitation, either her condition will improve or we can see if this is responsive to DDAVP.   -excessive thirst - try to wet mouth and not chug liquids -headache is likely secondary to dehydration  Hyperglycemia: -was drinking copious juices over the last 24 hours.   -stick to mostly water, please. -will recheck in AM  Thrombocytopenia -stable since admission -in 06/2018 was 190.   -likely reactive due to sepsis, or gestational -patient is normotensive and liver enzymes are normal  Anemia -stable since admission -continue PO iron supplementation if tolerated.   -mild, can re-prioritize after sepsis is no longer present.  IUP: -qshift NSTs are reactive and have normal baseline -normal fetal movement  -uterus is non-tender -can collect GBS swab tomorrow. -continue pelvic rocking several times daily -still breech - discussed primary LTCS.  She is Dr. Migdalia Dk patient prior to pregnancy, and I will schedule her surgery Aug 3 with Dr. Leafy Ro. -delivery is currently not indicated.  Will continue close monitoring of fetal status.   ----- Larey Days, MD, [place] Attending Obstetrician and Gynecologist [facility], Department of [facility]   In total, 75 minutes were spent today  in care for this patient, with high complexity of medical decision making.   More than half of this time was face to face.

## 2018-09-03 NOTE — Progress Notes (Signed)
ID Spoke to patient, chart reviewed and communicated with Dr.Ward Remingtyn is 35 weeks + pregnant  known to me from  her prior office visit with ESBL e.coli UTI in Jan when she was given fosfomycin and has been doing well with neg urine culture ( done by her ob after that) She presented with 1 day onset of back pain which she thought initially was braxton hicks, and then nausea and vomiting and was admitted with fever- Started with cough in the hospital today and COVID neg  has  e.coli bacteremia and because of her history esbl zosyn was changed to meropenem . [place] no obvious hydro , just some fullness of renal pelvis CXR   Continue meropenem Repeat Blood culture after 24 hrs of meropenem  to make sure the bacteremia is cleared She will need 10-14 days of IV ( ertapenem on discharge)  Incentive spirometry Hypokalemia- being replaced Discussed the management with the patient

## 2018-09-04 DIAGNOSIS — B9620 Unspecified Escherichia coli [E. coli] as the cause of diseases classified elsewhere: Secondary | ICD-10-CM

## 2018-09-04 DIAGNOSIS — Z87440 Personal history of urinary (tract) infections: Secondary | ICD-10-CM

## 2018-09-04 DIAGNOSIS — Z87891 Personal history of nicotine dependence: Secondary | ICD-10-CM

## 2018-09-04 DIAGNOSIS — Z3A35 35 weeks gestation of pregnancy: Secondary | ICD-10-CM

## 2018-09-04 DIAGNOSIS — O99113 Other diseases of the blood and blood-forming organs and certain disorders involving the immune mechanism complicating pregnancy, third trimester: Secondary | ICD-10-CM

## 2018-09-04 DIAGNOSIS — R7881 Bacteremia: Secondary | ICD-10-CM

## 2018-09-04 LAB — COMPREHENSIVE METABOLIC PANEL
ALT: 29 U/L (ref 0–44)
AST: 41 U/L (ref 15–41)
Albumin: 2.2 g/dL — ABNORMAL LOW (ref 3.5–5.0)
Alkaline Phosphatase: 84 U/L (ref 38–126)
Anion gap: 7 (ref 5–15)
BUN: 6 mg/dL (ref 6–20)
CO2: 20 mmol/L — ABNORMAL LOW (ref 22–32)
Calcium: 8 mg/dL — ABNORMAL LOW (ref 8.9–10.3)
Chloride: 109 mmol/L (ref 98–111)
Creatinine, Ser: 0.6 mg/dL (ref 0.44–1.00)
GFR calc Af Amer: >60 mL/min (ref >60)
GFR calc non Af Amer: >60 mL/min (ref >60)
Glucose, Bld: 135 mg/dL — ABNORMAL HIGH (ref 70–99)
Potassium: 3.4 mmol/L — ABNORMAL LOW (ref 3.5–5.1)
Sodium: 136 mmol/L (ref 135–145)
Total Bilirubin: 0.5 mg/dL (ref 0.3–1.2)
Total Protein: 5.4 g/dL — ABNORMAL LOW (ref 6.5–8.1)

## 2018-09-04 LAB — URINE CULTURE: Culture: >=100000 — AB

## 2018-09-04 LAB — CBC
HCT: 29.1 % — ABNORMAL LOW (ref 36.0–46.0)
Hemoglobin: 9.2 g/dL — ABNORMAL LOW (ref 12.0–15.0)
MCH: 30.3 pg (ref 26.0–34.0)
MCHC: 31.6 g/dL (ref 30.0–36.0)
MCV: 95.7 fL (ref 80.0–100.0)
Platelets: 152 10*3/uL (ref 150–400)
RBC: 3.04 MIL/uL — ABNORMAL LOW (ref 3.87–5.11)
RDW: 13.4 % (ref 11.5–15.5)
WBC: 8.2 10*3/uL (ref 4.0–10.5)
nRBC: 0 % (ref 0.0–0.2)

## 2018-09-04 LAB — TYPE AND SCREEN
ABO/RH(D): O POS
Antibody Screen: NEGATIVE

## 2018-09-04 MED ORDER — PROCHLORPERAZINE MALEATE 10 MG PO TABS
10.0000 mg | ORAL_TABLET | Freq: Once | ORAL | Status: AC
  Administered 2018-09-04: 10 mg via ORAL
  Filled 2018-09-04 (×2): qty 1

## 2018-09-04 MED ORDER — GUAIFENESIN-DM 100-10 MG/5ML PO SYRP
10.0000 mL | ORAL_SOLUTION | Freq: Once | ORAL | Status: AC
  Administered 2018-09-04: 10 mL via ORAL
  Filled 2018-09-04: qty 10

## 2018-09-04 MED ORDER — MENTHOL 3 MG MT LOZG
1.0000 | LOZENGE | OROMUCOSAL | Status: DC | PRN
  Administered 2018-09-04 (×2): 3 mg via ORAL
  Filled 2018-09-04: qty 9

## 2018-09-04 MED ORDER — GUAIFENESIN-DM 100-10 MG/5ML PO SYRP
10.0000 mL | ORAL_SOLUTION | ORAL | Status: DC | PRN
  Administered 2018-09-04 – 2018-09-06 (×8): 10 mL via ORAL
  Filled 2018-09-04 (×11): qty 10

## 2018-09-04 MED ORDER — SODIUM CHLORIDE 0.9 % IV SOLN
INTRAVENOUS | Status: DC | PRN
  Administered 2018-09-04: 15:00:00 via INTRAVENOUS

## 2018-09-04 MED ORDER — DIPHENHYDRAMINE HCL 25 MG PO CAPS: 25.0000 mg | ORAL_CAPSULE | Freq: Four times a day (QID) | ORAL | Status: DC | PRN

## 2018-09-04 MED ORDER — ZOLPIDEM TARTRATE 5 MG PO TABS
5.0000 mg | ORAL_TABLET | Freq: Every evening | ORAL | Status: DC | PRN
  Filled 2018-09-04: qty 1

## 2018-09-04 NOTE — Progress Notes (Signed)
Q shift NST completed. Paper strip sent done to medical records. Pt denies ctx pain, vaginal bleeding, LOF and states positive fetal movement.

## 2018-09-04 NOTE — Consult Note (Signed)
NAME: Mariah [name]  DOB: 1980-05-10  MRN: [id number]  Date/Time: 09/04/2018 8:42 AM  REQUESTING PROVIDER; DR. Elesa Massed Subjective:  REASON FOR CONSULT: e.coli bacteremia ? Mariah [name] is a 38 y.o. female who is [redacted] weeks pregnant Presents with pain nausea and vomiting.  Patient has history of ESBL E. coli UTI in the past and in January had a when seen mean and got a dose of fosfomycin.  Since then she says she has been doing fine.  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  She also had some chills and shakes yesterday.  She has been afebrile prior to that.  In The triage blood pressure was 136/70, heart rate of 127, temperature of 99.1. She had a WBC of 10.6, Hb of 11 and platelet of 133.  Blood culture and urine culture was sent.  She was initially started on ceftriaxone.  She had an ultrasound of the kidneys which revealed mild fullness of the renal pelvis slightly more on the right than on the left.  There was no ureteral or calyceal dilatation seen.  As the [facility] ID came back as Enterobacteriaceae and E. coli she was switched to meropenem with a past history of ESBL E. coli.  I am asked to see the patient for the same. Since coming to the hospital she developed a cough and got a chest x-ray which showed some increased markings in the lower lobes.  She also had another coronavirus test which was negative and respiratory viral PCR panel was also negative.  She has been very thirsty and drinking a lot of water and also passing a lot of urine.  Urine specific gravity has been on the lower side and OB/GYN has been questioning diabetes insipidus. She had a temperature of 101.6 yesterday evening and this morning has been 100.4. Patient has been feeling little better today. Past Medical History:  Diagnosis  Date  . Infection    Current UTI January 2020  . Urinary tract infection due to extended-spectrum beta lactamase (ESBL) producing Escherichia coli 03/23/2018  . Vaginal Pap smear, abnormal    HPV    History reviewed. No pertinent surgical history.  Social History   Socioeconomic History  . Marital status: Married    Spouse name: Judie Grieve  . Number of children: Not on file  . Years of education: Not on file  . Highest education level: Not on file  Occupational History  . Occupation: Airline pilot  Social Needs  . Financial resource strain: Not on file  . Food insecurity    Worry: Not on file    Inability: Not on file  . Transportation needs    Medical: Not on file    Non-medical: Not on file  Tobacco Use  . Smoking status: Former Smoker    Packs/day: 0.50    Years: 19.00    Pack years: 9.50    Types: Cigarettes    Start date: 2014  . Smokeless tobacco: Never Used  . Tobacco comment: Quit 5.5 years ago 2014  Substance and Sexual Activity  . Alcohol use: Not Currently  . Drug use: Never  . Sexual activity: Yes    Birth control/protection: None  Lifestyle  . Physical activity    Days per week: Not on file  Minutes per session: Not on file  . Stress: Not on file  Relationships  . Social Musician on phone: Not on file    Gets together: Not on file    Attends religious service: Not on file    Active member of club or organization: Not on file    Attends meetings of clubs or organizations: Not on file    Relationship status: Not on file  . Intimate partner violence    Fear of current or ex partner: Not on file    Emotionally abused: Not on file    Physically abused: Not on file    Forced sexual activity: Not on file  Other Topics Concern  . Not on file  Social History Narrative  . Not on file    Family History  Problem Relation Age of Onset  . ALS Mother   . Emphysema Father   . Lumbar disc disease Father   . Cancer Paternal Aunt   . Arthritis  Maternal Grandmother   . Heart disease Maternal Grandfather   . Osteopenia Paternal Grandmother   . Asthma Paternal Grandfather   . Glaucoma Paternal Grandfather    No Known Allergies  ? Current Facility-Administered Medications  Medication Dose Route Frequency Provider Last Rate Last Dose  . acetaminophen (TYLENOL) tablet 650 mg  650 mg Oral Q4H PRN Ward, Elenora Fender, MD   650 mg at 09/04/18 0301  . calcium carbonate (TUMS - dosed in mg elemental calcium) chewable tablet 400 mg of elemental calcium  2 tablet Oral Q4H PRN Genia Del, CNM   400 mg of elemental calcium at 09/04/18 0259  . lactated ringers infusion   Intravenous Continuous Genia Del, CNM 125 mL/hr at 09/04/18 0256    . meropenem (MERREM) 1 g in sodium chloride 0.9 % 100 mL IVPB  1 g Intravenous Q8H Hallaji, Sheema M, RPH 200 mL/hr at 09/04/18 0540 1 g at 09/04/18 0540  . ondansetron (ZOFRAN) injection 4 mg  4 mg Intravenous Q4H PRN Ward, Elenora Fender, MD   4 mg at 09/04/18 0618  . potassium chloride (K-DUR) CR tablet 40 mEq  40 mEq Oral TID Ward, Elenora Fender, MD   40 mEq at 09/03/18 2143     Abtx:  Anti-infectives (From admission, onward)   Start     Dose/Rate Route Frequency Ordered Stop   09/03/18 0615  meropenem (MERREM) 1 g in sodium chloride 0.9 % 100 mL IVPB     1 g 200 mL/hr over 30 Minutes Intravenous Every 8 hours 09/03/18 0604     09/02/18 1215  piperacillin-tazobactam (ZOSYN) IVPB 3.375 g  Status:  Discontinued     3.375 g 12.5 mL/hr over 240 Minutes Intravenous Every 8 hours 09/02/18 1205 09/03/18 0604   09/01/18 1900  cefTRIAXone (ROCEPHIN) 1 g in sodium chloride 0.9 % 100 mL IVPB  Status:  Discontinued     1 g 200 mL/hr over 30 Minutes Intravenous Every 24 hours 09/01/18 1808 09/02/18 1205      REVIEW OF SYSTEMS:  Const:  fever, chills, negative weight loss Eyes: negative diplopia or visual changes, negative eye pain ENT: negative coryza, negative sore throat Resp:  cough, nonproductive  hemoptysis, dyspnea Cards: negative for chest pain, palpitations, lower extremity edema GU: Increased frequency and volume of urine.  No hematuria GI: Negative for abdominal pain, diarrhea, bleeding, constipation Skin: negative for rash and pruritus Heme: negative for easy bruising and gum/nose bleeding MS: Has back pain and some  general weakness Neurolo:negative for headaches, dizziness, vertigo, memory problems  Psych: negative for feelings of anxiety, depression  Endocrine: No diabetes mellitus or thyroid issues allergy/Immunology- negative for any medication or food allergies ?  Objective:  VITALS:  BP 113/65 (BP Location: Right Arm)   Pulse 98   Temp 98.5 F (36.9 C) (Oral)   Resp 18   Ht  (1.778 m)   Wt 93 kg   LMP 12/26/2017   SpO2 97% Comment: Room Air  BMI 29.41 kg/m  PHYSICAL EXAM:  General: Alert, cooperative, no distress, appears stated age.  Head: Normocephalic, without obvious abnormality, atraumatic. Eyes: Conjunctivae clear, anicteric sclerae. Pupils are equal ENT Nares normal. No drainage or sinus tenderness. Lips, mucosa, and tongue normal. No Thrush Neck: Supple, symmetrical, no adenopathy, thyroid: non tender no carotid bruit and no JVD. Back: No CVA tenderness. Lungs: Clear to auscultation bilaterally. No Wheezing or Rhonchi. No rales. Heart: Regular rate and rhythm, no murmur, rub or gallop. Abdomen: Pregnant uterus size 36 weeks extremities: atraumatic, no cyanosis. No edema. No clubbing Skin: No rashes or lesions. Or bruising Lymph: Cervical, supraclavicular normal. Neurologic: Grossly non-focal Pertinent Labs Lab Results CBC    Component Value Date/Time   WBC 8.2 09/04/2018 0653   RBC 3.04 (L) 09/04/2018 0653   HGB 9.2 (L) 09/04/2018 0653   HCT 29.1 (L) 09/04/2018 0653   PLT 152 09/04/2018 0653   MCV 95.7 09/04/2018 0653   MCH 30.3 09/04/2018 0653   MCHC 31.6 09/04/2018 0653   RDW 13.4 09/04/2018 0653   LYMPHSABS 0.4 (L) 09/03/2018  0630   MONOABS 1.5 (H) 09/03/2018 0630   EOSABS 0.0 09/03/2018 0630   BASOSABS 0.0 09/03/2018 0630    CMP Latest Ref Rng & Units 09/04/2018 09/03/2018 09/03/2018  Glucose 70 - 99 mg/dL 161(W) 960(A) 540(J)  BUN 6 - 20 mg/dL Creatinine 0.44 - 1.00 mg/dL 8.11 9.14 7.82  Sodium 135 - 145 mmol/L 136 136 135  Potassium 3.5 - 5.1 mmol/L 3.4(L) 3.6 2.8(L)  Chloride 98 - 111 mmol/L 109 108 108  CO2 22 - 32 mmol/L 20(L) 21(L) 18(L)  Calcium 8.9 - 10.3 mg/dL 8.0(L) 8.3(L) 8.4(L)  Total Protein 6.5 - 8.1 g/dL 9.5(A) - 5.6(L)  Total Bilirubin 0.3 - 1.2 mg/dL 0.5 - 0.6  Alkaline Phos 38 - 126 U/L 84 - 86  AST 15 - 41 U/L 41 - 26  ALT 0 - 44 U/L 29 - 21      Microbiology: Recent Results (from the past 240 hour(s))  SARS Coronavirus 2 (CEPHEID - Performed in [facility] hospital lab), Hosp Order     Status: None   Collection Time: 09/01/18  5:30 PM   Specimen: Nasopharyngeal Swab  Result Value Ref Range Status   SARS Coronavirus 2 NEGATIVE NEGATIVE Final    Comment: (NOTE) If result is NEGATIVE SARS-CoV-2 target nucleic acids are NOT DETECTED. The SARS-CoV-2 RNA is generally detectable in upper and lower  respiratory specimens during the acute phase of infection. The lowest  concentration of SARS-CoV-2 viral copies this assay can detect is 250  copies / mL. A negative result does not preclude SARS-CoV-2 infection  and should not be used as the sole basis for treatment or other  patient management decisions.  A negative result may occur with  improper specimen collection / handling, submission of specimen other  than nasopharyngeal swab, presence of viral mutation(s) within the  areas targeted by this assay, and inadequate number of  viral copies  (<250 copies / mL). A negative result must be combined with clinical  observations, patient history, and epidemiological information. If result is POSITIVE SARS-CoV-2 target nucleic acids are DETECTED. The SARS-CoV-2 RNA is generally  detectable in upper and lower  respiratory specimens dur ing the acute phase of infection.  Positive  results are indicative of active infection with SARS-CoV-2.  Clinical  correlation with patient history and other diagnostic information is  necessary to determine patient infection status.  Positive results do  not rule out bacterial infection or co-infection with other viruses. If result is PRESUMPTIVE POSTIVE SARS-CoV-2 nucleic acids MAY BE PRESENT.   A presumptive positive result was obtained on the submitted specimen  and confirmed on repeat testing.  While 2019 novel coronavirus  (SARS-CoV-2) nucleic acids may be present in the submitted sample  additional confirmatory testing may be necessary for epidemiological  and / or clinical management purposes  to differentiate between  SARS-CoV-2 and other Sarbecovirus currently known to infect humans.  If clinically indicated additional testing with an alternate test  methodology [phone]) is advised. The SARS-CoV-2 RNA is generally  detectable in upper and lower respiratory sp ecimens during the acute  phase of infection. The expected result is Negative. Fact Sheet for Patients:  StrictlyIdeas.no Fact Sheet for Healthcare Providers: BankingDealers.co.za This test is not yet approved or cleared by the [place] FDA and has been authorized for detection and/or diagnosis of SARS-CoV-2 by FDA under an Emergency Use Authorization (EUA).  This EUA will remain in effect (meaning this test can be used) for the duration of the COVID-19 declaration under Section 564(b)(1) of the Act, 21 U.S.C. section 360bbb-3(b)(1), unless the authorization is terminated or revoked sooner. Performed at [facility], [address], [place], [place] [address]   Urine Culture     Status: Abnormal   Collection Time: 09/01/18  6:15 PM   Specimen: Urine, Random  Result Value Ref Range Status    Specimen Description   Final    URINE, RANDOM Performed at [facility], [address], [place], [place] [address]    Special Requests   Final    NONE Performed at [facility], [address] [place], [place] [address]    Culture (A)  Final    >=100,000 COLONIES/mL ESCHERICHIA COLI Confirmed Extended Spectrum Beta-Lactamase Producer (ESBL).  In bloodstream infections from ESBL organisms, carbapenems are preferred over piperacillin/tazobactam. They are shown to have a lower risk of mortality.    Report Status 09/04/2018 FINAL  Final   Organism ID, Bacteria ESCHERICHIA COLI (A)  Final      Susceptibility   Escherichia coli - MIC*    AMPICILLIN >=32 RESISTANT Resistant     CEFAZOLIN >=64 RESISTANT Resistant     CEFTRIAXONE >=64 RESISTANT Resistant     CIPROFLOXACIN >=4 RESISTANT Resistant     GENTAMICIN >=16 RESISTANT Resistant     IMIPENEM <=0.25 SENSITIVE Sensitive     NITROFURANTOIN 128 RESISTANT Resistant     TRIMETH/SULFA >=320 RESISTANT Resistant     AMPICILLIN/SULBACTAM >=32 RESISTANT Resistant     PIP/TAZO 8 SENSITIVE Sensitive     Extended ESBL POSITIVE Resistant     * >=100,000 COLONIES/mL ESCHERICHIA COLI  CULTURE, BLOOD (ROUTINE X 2) w Reflex to ID Panel     Status: None (Preliminary result)   Collection Time: 09/02/18 12:22 PM   Specimen: BLOOD  Result Value Ref Range Status   Specimen Description BLOOD RIGHT ANTECUBITAL  Final  Special Requests   Final    BOTTLES DRAWN AEROBIC AND ANAEROBIC Blood Culture results may not be optimal due to an excessive volume of blood received in culture bottles   Culture  Setup Time   Final    Organism ID to follow GRAM NEGATIVE RODS AEROBIC BOTTLE ONLY CRITICAL RESULT CALLED TO, READ BACK BY AND VERIFIED WITH: [facility]EMA HALLAJI AT [id number] 09/03/18 SDR Performed at [facility], [address] [place], [place] [address]    Culture GRAM NEGATIVE RODS  Final   Report Status PENDING  Incomplete  Blood  Culture ID Panel (Reflexed)     Status: Abnormal   Collection Time: 09/02/18 12:22 PM  Result Value Ref Range Status   Enterococcus species NOT DETECTED NOT DETECTED Final   Listeria monocytogenes NOT DETECTED NOT DETECTED Final   Staphylococcus species NOT DETECTED NOT DETECTED Final   Staphylococcus aureus (BCID) NOT DETECTED NOT DETECTED Final   Streptococcus species NOT DETECTED NOT DETECTED Final   Streptococcus agalactiae NOT DETECTED NOT DETECTED Final   Streptococcus pneumoniae NOT DETECTED NOT DETECTED Final   Streptococcus pyogenes NOT DETECTED NOT DETECTED Final   Acinetobacter baumannii NOT DETECTED NOT DETECTED Final   Enterobacteriaceae species DETECTED (A) NOT DETECTED Final    Comment: Enterobacteriaceae represent a large family of gram-negative bacteria, not a single organism. CRITICAL RESULT CALLED TO, READ BACK BY AND VERIFIED WITH:  SHEEMA HALLAJI AT [id number] 09/03/2018 SDR    Enterobacter cloacae complex NOT DETECTED NOT DETECTED Final   Escherichia coli DETECTED (A) NOT DETECTED Final    Comment: CRITICAL RESULT CALLED TO, READ BACK BY AND VERIFIED WITH:  SHEEMA HALLAJI AT 0516 09/03/2018 SDR    Klebsiella oxytoca NOT DETECTED NOT DETECTED Final   Klebsiella pneumoniae NOT DETECTED NOT DETECTED Final   Proteus species NOT DETECTED NOT DETECTED Final   Serratia marcescens NOT DETECTED NOT DETECTED Final   Carbapenem resistance NOT DETECTED NOT DETECTED Final   Haemophilus influenzae NOT DETECTED NOT DETECTED Final   Neisseria meningitidis NOT DETECTED NOT DETECTED Final   Pseudomonas aeruginosa NOT DETECTED NOT DETECTED Final   Candida albicans NOT DETECTED NOT DETECTED Final   Candida glabrata NOT DETECTED NOT DETECTED Final   Candida krusei NOT DETECTED NOT DETECTED Final   Candida parapsilosis NOT DETECTED NOT DETECTED Final   Candida tropicalis NOT DETECTED NOT DETECTED Final    Comment: Performed at [facility], [address] [place], [place]  [address]  CULTURE, BLOOD (ROUTINE X 2) w Reflex to ID Panel     Status: None (Preliminary result)   Collection Time: 09/02/18 12:31 PM   Specimen: BLOOD  Result Value Ref Range Status   Specimen Description BLOOD BLOOD RIGHT HAND  Final   Special Requests   Final    BOTTLES DRAWN AEROBIC AND ANAEROBIC Blood Culture adequate volume   Culture   Final    NO GROWTH 2 DAYS Performed at [facility], [address] [place], [place] [address]    Report Status PENDING  Incomplete  Respiratory Panel by PCR     Status: None   Collection Time: 09/03/18  3:58 AM  Result Value Ref Range Status   Adenovirus NOT DETECTED NOT DETECTED Final   Coronavirus 229E NOT DETECTED NOT DETECTED Final    Comment: (NOTE) The Coronavirus on the Respiratory Panel, DOES NOT test for the novel  Coronavirus (2019 nCoV)    Coronavirus HKU1 NOT DETECTED NOT DETECTED Final   Coronavirus NL63 NOT DETECTED  NOT DETECTED Final   Coronavirus OC43 NOT DETECTED NOT DETECTED Final   Metapneumovirus NOT DETECTED NOT DETECTED Final   Rhinovirus / Enterovirus NOT DETECTED NOT DETECTED Final   Influenza A NOT DETECTED NOT DETECTED Final   Influenza B NOT DETECTED NOT DETECTED Final   Parainfluenza Virus 1 NOT DETECTED NOT DETECTED Final   Parainfluenza Virus 2 NOT DETECTED NOT DETECTED Final   Parainfluenza Virus 3 NOT DETECTED NOT DETECTED Final   Parainfluenza Virus 4 NOT DETECTED NOT DETECTED Final   Respiratory Syncytial Virus NOT DETECTED NOT DETECTED Final   Bordetella pertussis NOT DETECTED NOT DETECTED Final   Chlamydophila pneumoniae NOT DETECTED NOT DETECTED Final   Mycoplasma pneumoniae NOT DETECTED NOT DETECTED Final    Comment: Performed at [facility], [address] [place], [place] [address]  SARS Coronavirus 2 (CEPHEID - Performed in [facility] hospital lab), Hosp Order     Status: None   Collection Time: 09/03/18  3:59 AM   Specimen: Nasopharyngeal Swab  Result Value Ref Range Status    SARS Coronavirus 2 NEGATIVE NEGATIVE Final    Comment: (NOTE) If result is NEGATIVE SARS-CoV-2 target nucleic acids are NOT DETECTED. The SARS-CoV-2 RNA is generally detectable in upper and lower  respiratory specimens during the acute phase of infection. The lowest  concentration of SARS-CoV-2 viral copies this assay can detect is 250  copies / mL. A negative result does not preclude SARS-CoV-2 infection  and should not be used as the sole basis for treatment or other  patient management decisions.  A negative result may occur with  improper specimen collection / handling, submission of specimen other  than nasopharyngeal swab, presence of viral mutation(s) within the  areas targeted by this assay, and inadequate number of viral copies  (<250 copies / mL). A negative result must be combined with clinical  observations, patient history, and epidemiological information. If result is POSITIVE SARS-CoV-2 target nucleic acids are DETECTED. The SARS-CoV-2 RNA is generally detectable in upper and lower  respiratory specimens dur ing the acute phase of infection.  Positive  results are indicative of active infection with SARS-CoV-2.  Clinical  correlation with patient history and other diagnostic information is  necessary to determine patient infection status.  Positive results do  not rule out bacterial infection or co-infection with other viruses. If result is PRESUMPTIVE POSTIVE SARS-CoV-2 nucleic acids MAY BE PRESENT.   A presumptive positive result was obtained on the submitted specimen  and confirmed on repeat testing.  While 2019 novel coronavirus  (SARS-CoV-2) nucleic acids may be present in the submitted sample  additional confirmatory testing may be necessary for epidemiological  and / or clinical management purposes  to differentiate between  SARS-CoV-2 and other Sarbecovirus currently known to infect humans.  If clinically indicated additional testing with an alternate test   methodology [phone]([id number]) is advised. The SARS-CoV-2 RNA is generally  detectable in upper and lower respiratory sp ecimens during the acute  phase of infection. The expected result is Negative. Fact Sheet for Patients:  BoilerBrush.com.cyhttps://www.fda.gov/media/136312/download Fact Sheet for Healthcare Providers: https://pope.com/https://www.fda.gov/media/136313/download This test is not yet approved or cleared by the [place] FDA and has been authorized for detection and/or diagnosis of SARS-CoV-2 by FDA under an Emergency Use Authorization (EUA).  This EUA will remain in effect (meaning this test can be used) for the duration of the COVID-19 declaration under Section 564(b)(1) of the Act, 21 U.S.C. section 360bbb-3(b)(1), unless the authorization is terminated or revoked  sooner. Performed at [facility], [address] [place], [place] [address]     IMAGING RESULTS: Chest x-ray reviewed personally Prominent bronchovascular interstitial markings in the lower lungs. ? Impression/Recommendation:  E. coli bacteremia which is very likely ESBL as the urine has extended spectrum beta-lactamase E. coli.. She has a history of ESBL E. coli UTI.  She has taken fosfomycin for it in January 2020.  Currently on meropenem. Repeat blood culture tomorrow and if negative can get a PICC line in by 24 hours. She will need ertapenem 1 g IV on discharge for at least 10 days. Discussed with patient like it did before in January to avoid reinfection.  This included abstaining from vaginal intercourse as previously she had mentioned about trauma caused after sex and infections following that.  Advised strict personal hygiene. ? ? ?[redacted] weeks pregnant.  Breech presentations so will be going for C-section at week 39. ___________________________________________________ Discussed with patient, requesting provider Note:  This document was prepared using Dragon voice recognition software and may include unintentional dictation  errors.

## 2018-09-04 NOTE — OB Triage Note (Signed)
Pt here for q shift NST. Pt denies VB, LOF and states +FM.

## 2018-09-05 DIAGNOSIS — N390 Urinary tract infection, site not specified: Secondary | ICD-10-CM

## 2018-09-05 DIAGNOSIS — R609 Edema, unspecified: Secondary | ICD-10-CM

## 2018-09-05 DIAGNOSIS — Z1612 Extended spectrum beta lactamase (ESBL) resistance: Secondary | ICD-10-CM

## 2018-09-05 LAB — CULTURE, BLOOD (ROUTINE X 2)

## 2018-09-05 LAB — TORCH-IGM(TOXO/ RUB/ CMV/ HSV) W TITER
CMV IgM: <30 AU/mL (ref 0.0–29.9)
HSVI/II Comb IgM: <0.91 Ratio (ref 0.00–0.90)
Rubella IgM: <20 AU/mL (ref 0.0–19.9)
Toxoplasma Antibody- IgM: <3 AU/mL (ref 0.0–7.9)

## 2018-09-05 LAB — INFECT DISEASE AB IGM REFLEX 1

## 2018-09-05 MED ORDER — SODIUM CHLORIDE 0.9 % IV SOLN
1.0000 g | INTRAVENOUS | Status: DC
  Administered 2018-09-06: 1000 mg via INTRAVENOUS
  Filled 2018-09-05 (×2): qty 1

## 2018-09-05 MED ORDER — FLUCONAZOLE 100MG IVPB
150.0000 mg | Freq: Once | INTRAVENOUS | Status: AC
  Administered 2018-09-05: 150 mg via INTRAVENOUS
  Filled 2018-09-05: qty 75

## 2018-09-05 NOTE — Progress Notes (Addendum)
Q shift NST completed. Cat 1 tracing with no ctx noted. Ward in  Department reviewed paper strip and ok with results. Paper strip left in clipboard on MB unit with pt label on it.

## 2018-09-05 NOTE — Progress Notes (Addendum)
ID Pt afebrile for > 36 hrs- Feeling much better Breathing better No fever Has some swelling ankles    Patient Vitals for the past 24 hrs:  BP Temp Temp src Pulse Resp SpO2  09/05/18 1500 110/68 98.3 F (36.8 C) Oral 88 18 97 %  09/05/18 1205 104/65 98 F (36.7 C) Oral 99 18 98 %  09/05/18 0742 110/60 98 F (36.7 C) Oral (!) 107 20 98 %  09/05/18 0300 111/61 98.5 F (36.9 C) Oral (!) 104 20 97 %  09/04/18 2305 114/66 98.3 F (36.8 C) Oral 100 18 98 %  09/04/18 1941 122/72 98.6 F (37 C) Oral (!) 113 18 98 %   I/O 2331/4490 Chest b/l air entry HSs 1s2 Ankle edema  CBC Latest Ref Rng & Units 09/04/2018 09/03/2018 09/02/2018  WBC 4.0 - 10.5 K/uL 8.2 11.6(H) 11.1(H)  Hemoglobin 12.0 - 15.0 g/dL 9.2(L) 9.9(L) 9.9(L)  Hematocrit 36.0 - 46.0 % 29.1(L) 30.7(L) 31.3(L)  Platelets 150 - 400 K/uL 152 130(L) 141(L)   CMP Latest Ref Rng & Units 09/04/2018 09/03/2018 09/03/2018  Glucose 70 - 99 mg/dL 135(H) 115(H) 139(H)  BUN 6 - 20 mg/dL 6 6 7   Creatinine 0.44 - 1.00 mg/dL 0.60 0.58 0.65  Sodium 135 - 145 mmol/L 136 136 135  Potassium 3.5 - 5.1 mmol/L 3.4(L) 3.6 2.8(L)  Chloride 98 - 111 mmol/L 109 108 108  CO2 22 - 32 mmol/L 20(L) 21(L) 18(L)  Calcium 8.9 - 10.3 mg/dL 8.0(L) 8.3(L) 8.4(L)  Total Protein 6.5 - 8.1 g/dL 5.4(L) - 5.6(L)  Total Bilirubin 0.3 - 1.2 mg/dL 0.5 - 0.6  Alkaline Phos 38 - 126 U/L 84 - 86  AST 15 - 41 U/L 41 - 26  ALT 0 - 44 U/L 29 - 21    Impression/recommendation ESBL E.coli Bacteremia with UTI- on meropenem- will change to ertapenem so as to prepare for discharge tomorrow- will need for 10 more days  PICC tomorrow -if blood culture in the morning is neg  Will place home antibiotic orders and will inform advance home care tomorrow Concern for DI- Dr.Ward restricting fluid  Discussed the management with patient and Dr.Ward

## 2018-09-05 NOTE — Treatment Plan (Signed)
Diagnosis: ESBL e.coli bacteremia with UTI Baseline Creatinine <1   No Known Allergies  OPAT Orders Discharge antibiotics: Ertapenem 1 gram IVPB every 24 hours until 09/15/18    [facility] Care Per Protocol:  Labs weekly while on IV antibiotics: _X_ CBC with differential  _X_ CMP   _X_ Please pull PIC at completion of IV antibiotics Fax weekly labs to [phone]   Call [phone] with any questions

## 2018-09-06 ENCOUNTER — Inpatient Hospital Stay: Payer: Self-pay

## 2018-09-06 LAB — CBC
HCT: 32 % — ABNORMAL LOW (ref 36.0–46.0)
Hemoglobin: 10.3 g/dL — ABNORMAL LOW (ref 12.0–15.0)
MCH: 30.8 pg (ref 26.0–34.0)
MCHC: 32.2 g/dL (ref 30.0–36.0)
MCV: 95.8 fL (ref 80.0–100.0)
Platelets: 182 10*3/uL (ref 150–400)
RBC: 3.34 MIL/uL — ABNORMAL LOW (ref 3.87–5.11)
RDW: 13.3 % (ref 11.5–15.5)
WBC: 8.2 10*3/uL (ref 4.0–10.5)
nRBC: 0 % (ref 0.0–0.2)

## 2018-09-06 LAB — COMPREHENSIVE METABOLIC PANEL
ALT: 38 U/L (ref 0–44)
AST: 53 U/L — ABNORMAL HIGH (ref 15–41)
Albumin: 2.2 g/dL — ABNORMAL LOW (ref 3.5–5.0)
Alkaline Phosphatase: 103 U/L (ref 38–126)
Anion gap: 8 (ref 5–15)
BUN: <5 mg/dL — ABNORMAL LOW (ref 6–20)
CO2: 19 mmol/L — ABNORMAL LOW (ref 22–32)
Calcium: 8 mg/dL — ABNORMAL LOW (ref 8.9–10.3)
Chloride: 111 mmol/L (ref 98–111)
Creatinine, Ser: 0.49 mg/dL (ref 0.44–1.00)
GFR calc Af Amer: >60 mL/min (ref >60)
GFR calc non Af Amer: >60 mL/min (ref >60)
Glucose, Bld: 98 mg/dL (ref 70–99)
Potassium: 3.9 mmol/L (ref 3.5–5.1)
Sodium: 138 mmol/L (ref 135–145)
Total Bilirubin: 0.6 mg/dL (ref 0.3–1.2)
Total Protein: 5.9 g/dL — ABNORMAL LOW (ref 6.5–8.1)

## 2018-09-06 LAB — OSMOLALITY, URINE: Osmolality, Ur: 385 mOsm/kg (ref 300–900)

## 2018-09-06 MED ORDER — POTASSIUM CHLORIDE CRYS ER 20 MEQ PO TBCR: 40.0000 meq | EXTENDED_RELEASE_TABLET | Freq: Three times a day (TID) | ORAL | 1 refills | Status: DC

## 2018-09-06 MED ORDER — SODIUM CHLORIDE 0.9 % IV SOLN: 1.0000 g | INTRAVENOUS | 0 refills | Status: DC

## 2018-09-06 NOTE — Progress Notes (Signed)
Monitors applied for q Shift NST. Pt denies VB, LOF, and states +FM. Initial FHT 135 will continue to assess.

## 2018-09-06 NOTE — Progress Notes (Signed)
All discharge instructions given to patient and she voices understanding of all instructions given. Picc line was placed see charting and patient already has home health lined up for antibiotics and is aware that potassium was called into her drugstore of choice.  She already has a f/u appt in am scheduled with her OB. Patient discharged home escorted out by cna via wheelchair.

## 2018-09-06 NOTE — Progress Notes (Signed)
Peripherally Inserted Central Catheter/Midline Placement  The IV Nurse has discussed with the patient and/or persons authorized to consent for the patient, the purpose of this procedure and the potential benefits and risks involved with this procedure.  The benefits include less needle sticks, lab draws from the catheter, and the patient may be discharged home with the catheter. Risks include, but not limited to, infection, bleeding, blood clot (thrombus formation), and puncture of an artery; nerve damage and irregular heartbeat and possibility to perform a PICC exchange if needed/ordered by physician.  Alternatives to this procedure were also discussed.  Bard Power PICC patient education guide, fact sheet on infection prevention and patient information card has been provided to patient /or left at bedside.    PICC/Midline Placement Documentation  PICC Single Lumen 17/40/81 PICC Left Cephalic 43 cm 0 cm (Active)  Indication for Insertion or Continuance of Line Home intravenous therapies (PICC only) 09/06/18 2022  Exposed Catheter (cm) 0 cm 09/06/18 2022  Site Assessment Clean;Dry;Intact 09/06/18 2022  Line Status Blood return noted;Flushed;Saline locked 09/06/18 2022  Dressing Type Transparent;Occlusive;Securing device 09/06/18 2022  Dressing Status Clean;Dry;Intact;Antimicrobial disc in place 09/06/18 2022  Line Adjustment (NICU/IV Team Only) No 09/06/18 2022  Dressing Intervention New dressing 09/06/18 2022  Dressing Change Due 09/13/18 09/06/18 2022       Edson Snowball 09/06/2018, 8:35 PM

## 2018-09-06 NOTE — Progress Notes (Signed)
NST reactive. Baseline 135 with 15x15 accels. No decels and no ctx noted. Paper strip labeled and put in basket on L&D to be sent to medical records. [place] CNM notified of reactive NST and gave verbal okay to remove pt from monitor.

## 2018-09-06 NOTE — Progress Notes (Signed)
Mariah [name] 2  38yo G1P0 @ 35+6w  S:  Feels much better today.  More energy, no nausea, headache is resolved, and definitely feels like she has turned a Human resources officer for discharge home. Afebrile x >24hrs.  .    She denies change of vision, RUQ pain, difficulty breathing.  She also denies change in cognition, weakness, change in gait.  +FM, no LOF, CTX, VB.  09/04/18: tmax 100.4 @ 0300.  She is feeling better, but still not great.  Consistent headache.  Was seen by ID; recommended stopping tylenol to see her fever curve without it.  Continued meropenem TID.  Still having low back pain - her typical pain for which she sees chiropractor normally. UOP >3L.  Labs stable.  Continue K+ replacement TID.   09/03/18: This morning blood culture x1 grew e.coli.  Pharmacy recommended change to meropenem, which was ordered.  Labs are stable.  She continues to have shaking chills just before a fever spike; fever curve trending down. Consulted ID. 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  Patient says thirst started with this illness, not before. Tmax 101.6. Robitussin for cough.   09/02/18: Tmax to 102, drew blood cultures and changed ceftriaxone to pip/tazo q8h.  renal ultrasound showed no hydronephrosis.  Bacterial sensitivities to previous EBSL e.coli showed sensitivites to carbapenems and pip/tazo, so continued pip/tazo.  At night was febrile to 100.8,  developed dry cough, retested for covid, respiratory panel, put on droplet precautions.  Covid again negative.  Respiratory panel negative. NST qshift reactive.    Day of admission: 09/01/18:  Hypertensive upon admission with tachycardia and elevated PC ratio.  After hydration BPs returned to normal, occasionally low.  Met SIRS criteria upon admission.  On NST, fetal hearts were tachycardic with accels, and return to normal baseline after maternal improvement.  Given 1 dose ceftriaxone.  UA concerning for infection, Ucx pending.  No  leukocytosis.  O:  Patient Vitals for the past 24 hrs:  BP Temp Temp src Pulse Resp SpO2  09/06/18 0357 98/62 98.3 F (36.8 C) Oral 90 20 96 %  09/05/18 2300 115/77 98.1 F (36.7 C) Oral (!) 101 20 99 %  09/05/18 1958 110/77 98.5 F (36.9 C) Oral 99 20 99 %  09/05/18 1500 110/68 98.3 F (36.8 C) Oral 88 18 97 %  09/05/18 1205 104/65 98 F (36.7 C) Oral 99 18 98 %  09/05/18 0742 110/60 98 F (36.7 C) Oral (!) 107 20 98 %   I/O not recorded from 0500-0700 Total out: 4525cc + unrecorded during 0500-0700.Marland Kitchen   GEN: no acute distress.   Resp: no increased work of breathing, lungs clear bilaterally on auscultation, diminished at bases, with improvement during incentive spirometry. CV: regular rhythm, tachycardic, no rubs/gallops/murmurs, +pedal pulses, 1+ bilateral LE edema Abd: non-tender. +bowel sounds, no radiating pain from right, non-tender gravid uterus. LEs:  No cords, warmth, erythema or tenderness Back: +tenderness in low back.  +CVA and flank tenderness on RIGHT, no tenderness on left.   Neuro:  No decreased range of motion of head and neck, negative signs for meningitis.  No results found for this or any previous visit (from the past 24 hour(s)). NST  Evening 09/03/18 Baseline: 160 Variability: moderate Accelerations present x >2 Decelerations absent Time 62mns  NST  Early AM 09/04/18 Baseline: 150 Variability: moderate Accelerations present x >2 Decelerations absent Time 235ms  Bedside USKoreabreech confirmed.  Us[place]oreaenal  Result Date: 09/02/2018 CLINICAL DATA:  Right  flank pain.  Third trimester gestation EXAM: RENAL / URINARY TRACT ULTRASOUND COMPLETE COMPARISON:  None. FINDINGS: Right Kidney: Renal measurements: 12.9 x 6.4 x 7.1 cm = volume: 310 mL . Echogenicity and renal cortical thickness are within normal limits. No mass or perinephric fluid visualized. There is mild pelviectasis without calyceal dilatation. No sonographically demonstrable calculus or  ureterectasis evident. Left Kidney: Renal measurements: 13.7 x 5.6 x 6.1 cm = volume: 247 mL. Echogenicity and renal cortical thickness within normal limits. No mass or perinephric fluid visualized. There is slight fullness of the left renal pelvis without caliectasis. Bladder: Appears normal for degree of bladder distention. IMPRESSION: Mild fullness of each renal pelvis, slightly more on the right than on the left. No ureteral or caliceal dilatation evident. No obstructing focus evident on either side. Study otherwise unremarkable. Electronically Signed   By: Lowella Grip III M.D.   On: 09/02/2018 13:29   Dg Chest Port 1 View  Result Date: 09/03/2018 CLINICAL DATA:  Cough with chills. EXAM: PORTABLE CHEST 1 VIEW COMPARISON:  None. FINDINGS: Cardiac silhouette is normal in size and configuration. No mediastinal or hilar masses. There is no evidence of adenopathy. There are prominent bronchovascular markings and mild interstitial thickening most evident in the lower lungs. Lungs are otherwise clear. No pleural effusion or pneumothorax. Skeletal structures are grossly intact. IMPRESSION: 1. Prominent bronchovascular and interstitial markings in the lower lungs. Possible acute interstitial infection/inflammation. Electronically Signed   By: Lajean Manes M.D.   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   1. Bacteremia:  Blood cultures drawn today, if negative will place PICC and d/c home with ertapenem. 2. Respiratory panel negative, covid negative. 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  D/C IVF and begin water restriction to 500cc/hr and see if there is decrease in output.  If not, then we can determine whether DDAVP is appropriate.  Hypokalemia - replacing with TID PO K-dur. 57m.  . 4. No evidence of meningitis. 5. HA: relieved with  compazine and sleep   6. Shift fetal NST 7. Pelvic exam deferred 8. Continue PO hydration/nutrition  9.  NST: Interpretation: reactive NST, category 1 tracing, for both NSTs performed and documented above.  Continue close monitoring.  DIscharge planning:  Will need case management to assist with home health for outpatient IV antibiotics. Will switch to ertapenem.  OB follow up on 7/16 @ [facility] with dr. BLeafy Ro  Will need to set up cesarean for 39wks.if still breech.   ----- CLarey Days MD, FTimkenAttending Obstetrician and Gynecologist [facility]

## 2018-09-06 NOTE — Progress Notes (Signed)
Spoke with Shirlean Mylar RN regarding PICC order. Relayed that PICC nurses are placing couple of urgent PICC at [facility]. Made aware that PICC will be placed tonight. Will pass it on to PICC nurse tonight that patient will be discharged after PICC placement.

## 2018-09-06 NOTE — Progress Notes (Signed)
[facility] consulted for PICC placement utilizing IVT consult.  Called unit and spoke with pt's nurse, Shirlean Mylar; she stated she knew the order wasn't placed correctly, but place PICC wasn't an option. Talked her through process of placing order for PICC and informed her that PICC RN would be contacting her for more information and to give her an idea of when line could be placed.

## 2018-09-06 NOTE — Progress Notes (Signed)
PHARMACY CONSULT NOTE FOR:  OUTPATIENT  PARENTERAL ANTIBIOTIC THERAPY (OPAT)  Indication:  ESBL e.coli bacteremia with UTI Regimen: ertapenem 1gm IV q24h End date: 09/15/2018  IV antibiotic discharge orders are pended. To discharging provider:  please sign these orders via discharge navigator,  Select New Orders & click on the button choice - Manage This Unsigned Work.     Thank you for allowing pharmacy to be a part of this patient's care.  Doreene Eland, PharmD, BCPS.   Work Cell: [phone] 09/06/2018 9:15 AM

## 2018-09-06 NOTE — Progress Notes (Signed)
Still waiting for PICC placement from [facility].  Administrative Coordinator, Eulis Manly RN, called due to poor communication from [facility], patient upset about waiting all day and now having to stay another night in hospital despite everything else in place for discharge.

## 2018-09-06 NOTE — Care Management Note (Signed)
Case Management Note  Patient Details  Name: RASHEDA LEDGER MRN: [id number] Date of Birth: [birth year]  Subjective/Objective:   Patient will be discharging to home on IV Ertapenem 1 gm every 24 hrs through 7/24.  Carolynn Sayers with [facility] has been notified and will arrange a home health RN with patient's [employer].  Spoke with patient and explained that Pam would be by to speak with her.  Patient verbalizes understanding.                    Action/Plan:   Expected Discharge Date:                  Expected Discharge Plan:  [place]  In-House Referral:     Discharge planning Services  CM Consult  Post Acute Care Choice:  Home Health Choice offered to:     DME Arranged:    DME Agency:     HH Arranged:  RN, IV Antibiotics HH Agency:     Status of Service:  Completed, signed off  If discussed at [place] of Stay Meetings, dates discussed:    Additional Comments:  Elza Rafter, RN 09/06/2018, 9:55 AM

## 2018-09-07 LAB — CULTURE, BLOOD (ROUTINE X 2)
Culture: NO GROWTH
Special Requests: ADEQUATE

## 2018-09-07 NOTE — Discharge Summary (Signed)
Patient ID: Mariah [name] MRN: [id number] DOB/AGE: Mar 06, 1980 38 y.o. Patient's last menstrual period was 12/26/2017. Estimated Date of Delivery: 10/02/18  Admit date: 09/01/2018 Discharge date: 09/07/2018  Admission Diagnoses:  1. Sepsis from MDR e.coli bacteremia,  2. [redacted] weeks EGA 3. Back pain 4. Headache   Discharge Diagnoses:  1. Sepsis from MDR e.colii bacteremia 2. [redacted] weeks EGA 3. Diabetes insipidus 4. Chronic Back pain  Prenatal Procedures:  NST each shift Renal ultrasound Blood and urine cultures COVID test - negative  Consults: pharmacy, infectious disease  Significant Diagnostic Studies:  Results for orders placed or performed during the hospital encounter of 09/01/18 (from the past 168 hour(s))  Comprehensive metabolic panel   Collection Time: 09/01/18  5:06 PM  Result Value Ref Range   Sodium 134 (L) 135 - 145 mmol/L   Potassium 3.3 (L) 3.5 - 5.1 mmol/L   Chloride 108 98 - 111 mmol/L   CO2 17 (L) 22 - 32 mmol/L   Glucose, Bld 97 70 - 99 mg/dL   BUN 9 6 - 20 mg/dL   Creatinine, Ser 4.090.55 0.44 - 1.00 mg/dL   Calcium 7.9 (L) 8.9 - 10.3 mg/dL   Total Protein 6.4 (L) 6.5 - 8.1 g/dL   Albumin 2.8 (L) 3.5 - 5.0 g/dL   AST 21 15 - 41 U/L   ALT 19 0 - 44 U/L   Alkaline Phosphatase 90 38 - 126 U/L   Total Bilirubin 0.8 0.3 - 1.2 mg/dL   GFR calc non Af Amer >60 >60 mL/min   GFR calc Af Amer >60 >60 mL/min   Anion gap 9 5 - 15  CBC on admission   Collection Time: 09/01/18  5:06 PM  Result Value Ref Range   WBC 10.6 (H) 4.0 - 10.5 K/uL   RBC 3.57 (L) 3.87 - 5.11 MIL/uL   Hemoglobin 11.0 (L) 12.0 - 15.0 g/dL   HCT 81.133.4 (L) 91.436.0 - 78.246.0 %   MCV 93.6 80.0 - 100.0 fL   MCH 30.8 26.0 - 34.0 pg   MCHC 32.9 30.0 - 36.0 g/dL   RDW 95.613.1 21.311.5 - 08.615.5 %   Platelets 133 (L) 150 - 400 K/uL   nRBC 0.0 0.0 - 0.2 %  Type and screen [facility]   Collection Time: 09/01/18  5:06 PM  Result Value Ref Range   ABO/RH(D) O POS    Antibody Screen NEG     Sample Expiration      09/04/2018,2359 Performed at [facility], [address] [place], [place] [address]   Protein / creatinine ratio, urine   Collection Time: 09/01/18  5:07 PM  Result Value Ref Range   Creatinine, Urine 59 mg/dL   Total Protein, Urine 66 mg/dL   Protein Creatinine Ratio 1.12 (H) 0.00 - 0.15 mg/mg[Cre]  Urinalysis, Complete w Microscopic   Collection Time: 09/01/18  5:07 PM  Result Value Ref Range   Color, Urine YELLOW (A) YELLOW   APPearance CLOUDY (A) CLEAR   Specific Gravity, Urine 1.011 1.005 - 1.030   pH 7.0 5.0 - 8.0   Glucose, UA NEGATIVE NEGATIVE mg/dL   Hgb urine dipstick SMALL (A) NEGATIVE   Bilirubin Urine NEGATIVE NEGATIVE   Ketones, ur NEGATIVE NEGATIVE mg/dL   Protein, ur 962100 (A) NEGATIVE mg/dL   Nitrite NEGATIVE NEGATIVE   Leukocytes,Ua LARGE (A) NEGATIVE   RBC / HPF 6-10 0 - 5 RBC/hpf   WBC, UA >50 (H) 0 - 5 WBC/hpf  Bacteria, UA MANY (A) NONE SEEN   Squamous Epithelial / LPF 0-5 0 - 5   Mucus PRESENT   SARS Coronavirus 2 (CEPHEID - Performed in [facility] hospital lab), [facility] Order   Collection Time: 09/01/18  5:30 PM   Specimen: Nasopharyngeal Swab  Result Value Ref Range   SARS Coronavirus 2 NEGATIVE NEGATIVE  Urine Culture   Collection Time: 09/01/18  6:15 PM   Specimen: Urine, Random  Result Value Ref Range   Specimen Description      URINE, RANDOM Performed at [facility], [address], [place], [place] [address]    Special Requests      NONE Performed at [facility], [address] [place], [place] [address]    Culture (A)     >=100,000 COLONIES/mL ESCHERICHIA COLI Confirmed Extended Spectrum Beta-Lactamase Producer (ESBL).  In bloodstream infections from ESBL organisms, carbapenems are preferred over piperacillin/tazobactam. They are shown to have a lower risk of mortality.    Report Status 09/04/2018 FINAL    Organism ID, Bacteria ESCHERICHIA COLI (A)       Susceptibility    Escherichia coli - MIC*    AMPICILLIN >=32 RESISTANT Resistant     CEFAZOLIN >=64 RESISTANT Resistant     CEFTRIAXONE >=64 RESISTANT Resistant     CIPROFLOXACIN >=4 RESISTANT Resistant     GENTAMICIN >=16 RESISTANT Resistant     IMIPENEM <=0.25 SENSITIVE Sensitive     NITROFURANTOIN 128 RESISTANT Resistant     TRIMETH/SULFA >=320 RESISTANT Resistant     AMPICILLIN/SULBACTAM >=32 RESISTANT Resistant     PIP/TAZO 8 SENSITIVE Sensitive     Extended ESBL POSITIVE Resistant     * >=100,000 COLONIES/mL ESCHERICHIA COLI  CBC   Collection Time: 09/02/18  6:46 AM  Result Value Ref Range   WBC 11.1 (H) 4.0 - 10.5 K/uL   RBC 3.26 (L) 3.87 - 5.11 MIL/uL   Hemoglobin 9.9 (L) 12.0 - 15.0 g/dL   HCT 09.8 (L) 11.9 - 14.7 %   MCV 96.0 80.0 - 100.0 fL   MCH 30.4 26.0 - 34.0 pg   MCHC 31.6 30.0 - 36.0 g/dL   RDW 82.9 56.2 - 13.0 %   Platelets 141 (L) 150 - 400 K/uL   nRBC 0.0 0.0 - 0.2 %  Differential   Collection Time: 09/02/18  6:46 AM  Result Value Ref Range   Neutrophils Relative % 78 %   Neutro Abs 8.4 (H) 1.7 - 7.7 K/uL   Lymphocytes Relative 7 %   Lymphs Abs 0.7 0.7 - 4.0 K/uL   Monocytes Relative 14 %   Monocytes Absolute 1.5 (H) 0.1 - 1.0 K/uL   Eosinophils Relative 0 %   Eosinophils Absolute 0.0 0.0 - 0.5 K/uL   Basophils Relative 0 %   Basophils Absolute 0.0 0.0 - 0.1 K/uL  CULTURE, BLOOD (ROUTINE X 2) w Reflex to ID Panel   Collection Time: 09/02/18 12:22 PM   Specimen: BLOOD  Result Value Ref Range   Specimen Description      BLOOD RIGHT ANTECUBITAL Performed at [facility], [address] [place], [place] [address]    Special Requests      BOTTLES DRAWN AEROBIC AND ANAEROBIC Blood Culture results may not be optimal due to an excessive volume of blood received in culture bottles Performed at [facility], [address], [place], [place] [address]    Culture  Setup Time      GRAM NEGATIVE RODS AEROBIC BOTTLE  ONLY CRITICAL RESULT CALLED TO, READ  BACK BY AND VERIFIED WITHCher Nakai AT 1610 09/03/18 SDR Performed at [facility], [address], [place], [place] [address]    Culture (A)     ESCHERICHIA COLI Confirmed Extended Spectrum Beta-Lactamase Producer (ESBL).  In bloodstream infections from ESBL organisms, carbapenems are preferred over piperacillin/tazobactam. They are shown to have a lower risk of mortality.    Report Status 09/05/2018 FINAL    Organism ID, Bacteria ESCHERICHIA COLI       Susceptibility   Escherichia coli - MIC*    AMPICILLIN >=32 RESISTANT Resistant     CEFAZOLIN >=64 RESISTANT Resistant     CEFEPIME RESISTANT Resistant     CEFTAZIDIME RESISTANT Resistant     CEFTRIAXONE >=64 RESISTANT Resistant     CIPROFLOXACIN >=4 RESISTANT Resistant     GENTAMICIN >=16 RESISTANT Resistant     IMIPENEM <=0.25 SENSITIVE Sensitive     TRIMETH/SULFA >=320 RESISTANT Resistant     AMPICILLIN/SULBACTAM >=32 RESISTANT Resistant     PIP/TAZO 8 SENSITIVE Sensitive     Extended ESBL POSITIVE Resistant     * ESCHERICHIA COLI  Blood Culture ID Panel (Reflexed)   Collection Time: 09/02/18 12:22 PM  Result Value Ref Range   Enterococcus species NOT DETECTED NOT DETECTED   Listeria monocytogenes NOT DETECTED NOT DETECTED   Staphylococcus species NOT DETECTED NOT DETECTED   Staphylococcus aureus (BCID) NOT DETECTED NOT DETECTED   Streptococcus species NOT DETECTED NOT DETECTED   Streptococcus agalactiae NOT DETECTED NOT DETECTED   Streptococcus pneumoniae NOT DETECTED NOT DETECTED   Streptococcus pyogenes NOT DETECTED NOT DETECTED   Acinetobacter baumannii NOT DETECTED NOT DETECTED   Enterobacteriaceae species DETECTED (A) NOT DETECTED   Enterobacter cloacae complex NOT DETECTED NOT DETECTED   Escherichia coli DETECTED (A) NOT DETECTED   Klebsiella oxytoca NOT DETECTED NOT DETECTED   Klebsiella pneumoniae NOT DETECTED NOT DETECTED   Proteus species NOT DETECTED NOT DETECTED   Serratia marcescens NOT DETECTED  NOT DETECTED   Carbapenem resistance NOT DETECTED NOT DETECTED   Haemophilus influenzae NOT DETECTED NOT DETECTED   Neisseria meningitidis NOT DETECTED NOT DETECTED   Pseudomonas aeruginosa NOT DETECTED NOT DETECTED   Candida albicans NOT DETECTED NOT DETECTED   Candida glabrata NOT DETECTED NOT DETECTED   Candida krusei NOT DETECTED NOT DETECTED   Candida parapsilosis NOT DETECTED NOT DETECTED   Candida tropicalis NOT DETECTED NOT DETECTED  Comprehensive metabolic panel   Collection Time: 09/02/18 12:22 PM  Result Value Ref Range   Sodium 136 135 - 145 mmol/L   Potassium 3.6 3.5 - 5.1 mmol/L   Chloride 109 98 - 111 mmol/L   CO2 18 (L) 22 - 32 mmol/L   Glucose, Bld 134 (H) 70 - 99 mg/dL   BUN 6 6 - 20 mg/dL   Creatinine, Ser 4.09 0.44 - 1.00 mg/dL   Calcium 8.2 (L) 8.9 - 10.3 mg/dL   Total Protein 5.7 (L) 6.5 - 8.1 g/dL   Albumin 2.5 (L) 3.5 - 5.0 g/dL   AST 25 15 - 41 U/L   ALT 19 0 - 44 U/L   Alkaline Phosphatase 83 38 - 126 U/L   Total Bilirubin 0.5 0.3 - 1.2 mg/dL   GFR calc non Af Amer >60 >60 mL/min   GFR calc Af Amer >60 >60 mL/min   Anion gap 9 5 - 15  Torch-IgM (toxo/rub/cmv/hsv) w rflx titr   Collection Time: 09/02/18 12:22 PM  Result Value  Ref Range   Rubella IgM <20.0 0.0 - 19.9 AU/mL   Toxoplasma Antibody- IgM <3.0 0.0 - 7.9 AU/mL   CMV IgM <30.0 0.0 - 29.9 AU/mL   HSVI/II Comb IgM <0.91 0.00 - 0.90 Ratio  Infect disease Ab IgM reflex 1   Collection Time: 09/02/18 12:22 PM  Result Value Ref Range   Infect disease Ab comment 1 Comment   CULTURE, BLOOD (ROUTINE X 2) w Reflex to ID Panel   Collection Time: 09/02/18 12:31 PM   Specimen: BLOOD  Result Value Ref Range   Specimen Description BLOOD BLOOD RIGHT HAND    Special Requests      BOTTLES DRAWN AEROBIC AND ANAEROBIC Blood Culture adequate volume   Culture      NO GROWTH 4 DAYS Performed at [facility], [address] [place], [place] [address]    Report Status PENDING   Lactic acid,  plasma   Collection Time: 09/02/18  3:56 PM  Result Value Ref Range   Lactic Acid, Venous 1.4 0.5 - 1.9 mmol/L  Respiratory Panel by PCR   Collection Time: 09/03/18  3:58 AM  Result Value Ref Range   Adenovirus NOT DETECTED NOT DETECTED   Coronavirus 229E NOT DETECTED NOT DETECTED   Coronavirus HKU1 NOT DETECTED NOT DETECTED   Coronavirus NL63 NOT DETECTED NOT DETECTED   Coronavirus OC43 NOT DETECTED NOT DETECTED   Metapneumovirus NOT DETECTED NOT DETECTED   Rhinovirus / Enterovirus NOT DETECTED NOT DETECTED   Influenza A NOT DETECTED NOT DETECTED   Influenza B NOT DETECTED NOT DETECTED   Parainfluenza Virus 1 NOT DETECTED NOT DETECTED   Parainfluenza Virus 2 NOT DETECTED NOT DETECTED   Parainfluenza Virus 3 NOT DETECTED NOT DETECTED   Parainfluenza Virus 4 NOT DETECTED NOT DETECTED   Respiratory Syncytial Virus NOT DETECTED NOT DETECTED   Bordetella pertussis NOT DETECTED NOT DETECTED   Chlamydophila pneumoniae NOT DETECTED NOT DETECTED   Mycoplasma pneumoniae NOT DETECTED NOT DETECTED  SARS Coronavirus 2 (CEPHEID - Performed in [facility] hospital lab), [facility] Order   Collection Time: 09/03/18  3:59 AM   Specimen: Nasopharyngeal Swab  Result Value Ref Range   SARS Coronavirus 2 NEGATIVE NEGATIVE  CBC with Differential/Platelet   Collection Time: 09/03/18  6:30 AM  Result Value Ref Range   WBC 11.6 (H) 4.0 - 10.5 K/uL   RBC 3.20 (L) 3.87 - 5.11 MIL/uL   Hemoglobin 9.9 (L) 12.0 - 15.0 g/dL   HCT 29.5 (L) 62.1 - 30.8 %   MCV 95.9 80.0 - 100.0 fL   MCH 30.9 26.0 - 34.0 pg   MCHC 32.2 30.0 - 36.0 g/dL   RDW 65.7 84.6 - 96.2 %   Platelets 130 (L) 150 - 400 K/uL   nRBC 0.0 0.0 - 0.2 %   Neutrophils Relative % 81 %   Neutro Abs 9.4 (H) 1.7 - 7.7 K/uL   Lymphocytes Relative 4 %   Lymphs Abs 0.4 (L) 0.7 - 4.0 K/uL   Monocytes Relative 13 %   Monocytes Absolute 1.5 (H) 0.1 - 1.0 K/uL   Eosinophils Relative 0 %   Eosinophils Absolute 0.0 0.0 - 0.5 K/uL   Basophils Relative 0 %    Basophils Absolute 0.0 0.0 - 0.1 K/uL   Immature Granulocytes 2 %   Abs Immature Granulocytes 0.22 (H) 0.00 - 0.07 K/uL  Basic metabolic panel   Collection Time: 09/03/18  6:30 AM  Result Value Ref Range   Sodium 135 135 -  145 mmol/L   Potassium 2.8 (L) 3.5 - 5.1 mmol/L   Chloride 108 98 - 111 mmol/L   CO2 18 (L) 22 - 32 mmol/L   Glucose, Bld 139 (H) 70 - 99 mg/dL   BUN 7 6 - 20 mg/dL   Creatinine, Ser 1.610.65 0.44 - 1.00 mg/dL   Calcium 8.4 (L) 8.9 - 10.3 mg/dL   GFR calc non Af Amer >60 >60 mL/min   GFR calc Af Amer >60 >60 mL/min   Anion gap 9 5 - 15  Hepatic function panel   Collection Time: 09/03/18  6:30 AM  Result Value Ref Range   Total Protein 5.6 (L) 6.5 - 8.1 g/dL   Albumin 2.3 (L) 3.5 - 5.0 g/dL   AST 26 15 - 41 U/L   ALT 21 0 - 44 U/L   Alkaline Phosphatase 86 38 - 126 U/L   Total Bilirubin 0.6 0.3 - 1.2 mg/dL   Bilirubin, Direct 0.2 0.0 - 0.2 mg/dL   Indirect Bilirubin 0.4 0.3 - 0.9 mg/dL  Osmolality, urine   Collection Time: 09/03/18  1:15 PM  Result Value Ref Range   Osmolality, Ur 321 300 - 900 mOsm/kg  Basic metabolic panel   Collection Time: 09/03/18  7:45 PM  Result Value Ref Range   Sodium 136 135 - 145 mmol/L   Potassium 3.6 3.5 - 5.1 mmol/L   Chloride 108 98 - 111 mmol/L   CO2 21 (L) 22 - 32 mmol/L   Glucose, Bld 115 (H) 70 - 99 mg/dL   BUN 6 6 - 20 mg/dL   Creatinine, Ser 0.960.58 0.44 - 1.00 mg/dL   Calcium 8.3 (L) 8.9 - 10.3 mg/dL   GFR calc non Af Amer >60 >60 mL/min   GFR calc Af Amer >60 >60 mL/min   Anion gap 7 5 - 15  TSH   Collection Time: 09/03/18  7:45 PM  Result Value Ref Range   TSH 0.972 0.350 - 4.500 uIU/mL  CBC   Collection Time: 09/04/18  6:53 AM  Result Value Ref Range   WBC 8.2 4.0 - 10.5 K/uL   RBC 3.04 (L) 3.87 - 5.11 MIL/uL   Hemoglobin 9.2 (L) 12.0 - 15.0 g/dL   HCT 04.529.1 (L) 40.936.0 - 81.146.0 %   MCV 95.7 80.0 - 100.0 fL   MCH 30.3 26.0 - 34.0 pg   MCHC 31.6 30.0 - 36.0 g/dL   RDW 91.413.4 78.211.5 - 95.615.5 %   Platelets 152 150 -  400 K/uL   nRBC 0.0 0.0 - 0.2 %  Comprehensive metabolic panel   Collection Time: 09/04/18  6:53 AM  Result Value Ref Range   Sodium 136 135 - 145 mmol/L   Potassium 3.4 (L) 3.5 - 5.1 mmol/L   Chloride 109 98 - 111 mmol/L   CO2 20 (L) 22 - 32 mmol/L   Glucose, Bld 135 (H) 70 - 99 mg/dL   BUN 6 6 - 20 mg/dL   Creatinine, Ser 2.130.60 0.44 - 1.00 mg/dL   Calcium 8.0 (L) 8.9 - 10.3 mg/dL   Total Protein 5.4 (L) 6.5 - 8.1 g/dL   Albumin 2.2 (L) 3.5 - 5.0 g/dL   AST 41 15 - 41 U/L   ALT 29 0 - 44 U/L   Alkaline Phosphatase 84 38 - 126 U/L   Total Bilirubin 0.5 0.3 - 1.2 mg/dL   GFR calc non Af Amer >60 >60 mL/min   GFR calc Af Amer >60 >60 mL/min  Anion gap 7 5 - 15  Type and screen [facility]   Collection Time: 09/04/18  6:53 AM  Result Value Ref Range   ABO/RH(D) O POS    Antibody Screen NEG    Sample Expiration      09/07/2018,2359 Performed at [facility], [address] [place], [place] [address]   CULTURE, BLOOD (ROUTINE X 2) w Reflex to ID Panel   Collection Time: 09/05/18  8:49 AM   Specimen: BLOOD  Result Value Ref Range   Specimen Description BLOOD LEFT AC    Special Requests      BOTTLES DRAWN AEROBIC AND ANAEROBIC Blood Culture adequate volume   Culture      NO GROWTH 1 DAY Performed at [facility], [address] [place], [place] [address]    Report Status PENDING   CULTURE, BLOOD (ROUTINE X 2) w Reflex to ID Panel   Collection Time: 09/05/18  8:58 AM   Specimen: BLOOD  Result Value Ref Range   Specimen Description BLOOD LEFT HAND    Special Requests      BOTTLES DRAWN AEROBIC AND ANAEROBIC Blood Culture adequate volume   Culture      NO GROWTH 1 DAY Performed at [facility], [address] [place], [place] [address]    Report Status PENDING   Comprehensive metabolic panel   Collection Time: 09/06/18  8:26 AM  Result Value Ref Range   Sodium 138 135 - 145 mmol/L   Potassium 3.9 3.5 - 5.1 mmol/L    Chloride 111 98 - 111 mmol/L   CO2 19 (L) 22 - 32 mmol/L   Glucose, Bld 98 70 - 99 mg/dL   BUN <5 (L) 6 - 20 mg/dL   Creatinine, Ser 9.140.49 0.44 - 1.00 mg/dL   Calcium 8.0 (L) 8.9 - 10.3 mg/dL   Total Protein 5.9 (L) 6.5 - 8.1 g/dL   Albumin 2.2 (L) 3.5 - 5.0 g/dL   AST 53 (H) 15 - 41 U/L   ALT 38 0 - 44 U/L   Alkaline Phosphatase 103 38 - 126 U/L   Total Bilirubin 0.6 0.3 - 1.2 mg/dL   GFR calc non Af Amer >60 >60 mL/min   GFR calc Af Amer >60 >60 mL/min   Anion gap 8 5 - 15  CBC   Collection Time: 09/06/18  8:26 AM  Result Value Ref Range   WBC 8.2 4.0 - 10.5 K/uL   RBC 3.34 (L) 3.87 - 5.11 MIL/uL   Hemoglobin 10.3 (L) 12.0 - 15.0 g/dL   HCT 78.232.0 (L) 95.636.0 - 21.346.0 %   MCV 95.8 80.0 - 100.0 fL   MCH 30.8 26.0 - 34.0 pg   MCHC 32.2 30.0 - 36.0 g/dL   RDW 08.613.3 57.811.5 - 46.915.5 %   Platelets 182 150 - 400 K/uL   nRBC 0.0 0.0 - 0.2 %  Osmolality, urine   Collection Time: 09/06/18  8:55 AM  Result Value Ref Range   Osmolality, Ur 385 300 - 900 mOsm/kg    Treatments:  Antibiotics:  Ceftriaxone, pip/tazo, meropenem, ertapenem.  Discharged on ertapenem.  DI: fluid restriction on day of discharge.  Hospital Course:  This is a 38 y.o. G1P0 with IUP at 7649w3d admitted for generally feeling unwell, tachycardia, fetal tachycardia, and chills with back pain. No leaking of fluid and no bleeding.   She was given ceftriaxone for concern of a UTI and admitted for observation.  Day 2 she spiked  a temp of 102 and blood cultures drawn grew out e. Coli.  She was put on meropenem and her fever curve reduced. It was advised that she go home on ertapenem, and home health arranged for this to happn with a  PICC line.  She was also observed to have >3L UOP for each day of her admission and had excessive thirst.  She began a fluid restriction on dau of discharge. Once PICC placed, she was deemed stable for discharge to home with outpatient follow up.  Discharge Physical Exam:  BP 127/79 (BP Location: Left  Arm)   Pulse (!) 101   Temp 98.3 F (36.8 C) (Oral)   Resp 18   Ht 5\' 10"  (1.778 m)   Wt 93 kg   LMP 12/26/2017   SpO2 99%   BMI 29.41 kg/m   General: NAD CV: RRR Pulm: CTABL, nl effort ABD: s/nd/nt, gravid DVT Evaluation: LE non-ttp, no evidence of DVT on exam.  NST  Patient's last menstrual period was 12/26/2017. Estimated Date of Delivery: 10/02/18  Baseline: 130 Variability: moderate Accelerations present x >2 Decelerations absent Time 36mins  Interpretation: reactive NST, category 1 tracing  Discharge Condition: Stable  Disposition: Discharge disposition: 01-Home or Self Care       Discharge Instructions    Fetal Kick Count:  Lie on our left side for one hour after a meal, and count the number of times your baby kicks.  If it is less than 5 times, get up, move around and drink some juice.  Repeat the test 30 minutes later.  If it is still less than 5 kicks in an hour, notify your doctor.   Complete by: As directed      Allergies as of 09/06/2018   No Known Allergies     Medication List    TAKE these medications   cetirizine 10 MG tablet Commonly known as: ZYRTEC Take 10 mg by mouth daily.   ertapenem 1,000 mg in sodium chloride 0.9 % 100 mL Inject 1,000 mg into the vein daily.   fluticasone 27.5 MCG/SPRAY nasal spray Commonly known as: VERAMYST Place 2 sprays into the nose daily.   montelukast 10 MG tablet Commonly known as: SINGULAIR Take 10 mg by mouth at bedtime.   potassium chloride SA 20 MEQ tablet Commonly known as: K-DUR Take 2 tablets (40 mEq total) by mouth 3 (three) times daily.   prenatal multivitamin Tabs tablet Take 1 tablet by mouth daily at 12 noon.   pyridoxine 100 MG tablet Commonly known as: B-6 Take 100 mg by mouth daily.        Signed: ----- Larey Days, MD Attending Obstetrician and Gynecologist [facility]

## 2018-09-10 LAB — CULTURE, BLOOD (ROUTINE X 2)
Culture: NO GROWTH
Culture: NO GROWTH
Special Requests: ADEQUATE
Special Requests: ADEQUATE

## 2018-09-12 ENCOUNTER — Other Ambulatory Visit
Admission: RE | Admit: 2018-09-12 | Discharge: 2018-09-12 | Disposition: A | Discharge status: Home / Self Care | Payer: BC Managed Care – PPO | Source: Ambulatory Visit | Attending: Infectious Diseases | Admitting: Infectious Diseases

## 2018-09-12 DIAGNOSIS — N390 Urinary tract infection, site not specified: Secondary | ICD-10-CM | POA: Insufficient documentation

## 2018-09-12 DIAGNOSIS — Z1612 Extended spectrum beta lactamase (ESBL) resistance: Secondary | ICD-10-CM | POA: Insufficient documentation

## 2018-09-12 DIAGNOSIS — A4151 Sepsis due to Escherichia coli [E. coli]: Secondary | ICD-10-CM | POA: Insufficient documentation

## 2018-09-12 LAB — COMPREHENSIVE METABOLIC PANEL
ALT: 31 U/L (ref 0–44)
AST: 31 U/L (ref 15–41)
Albumin: 2.7 g/dL — ABNORMAL LOW (ref 3.5–5.0)
Alkaline Phosphatase: 112 U/L (ref 38–126)
Anion gap: 12 (ref 5–15)
BUN: 8 mg/dL (ref 6–20)
CO2: 19 mmol/L — ABNORMAL LOW (ref 22–32)
Calcium: 8.4 mg/dL — ABNORMAL LOW (ref 8.9–10.3)
Chloride: 105 mmol/L (ref 98–111)
Creatinine, Ser: 0.53 mg/dL (ref 0.44–1.00)
GFR calc Af Amer: >60 mL/min (ref >60)
GFR calc non Af Amer: >60 mL/min (ref >60)
Glucose, Bld: 95 mg/dL (ref 70–99)
Potassium: 4.3 mmol/L (ref 3.5–5.1)
Sodium: 136 mmol/L (ref 135–145)
Total Bilirubin: 0.4 mg/dL (ref 0.3–1.2)
Total Protein: 6.4 g/dL — ABNORMAL LOW (ref 6.5–8.1)

## 2018-09-12 LAB — CBC WITH DIFFERENTIAL/PLATELET
Abs Immature Granulocytes: 0.57 10*3/uL — ABNORMAL HIGH (ref 0.00–0.07)
Basophils Absolute: 0 10*3/uL (ref 0.0–0.1)
Basophils Relative: 0 %
Eosinophils Absolute: 0.1 10*3/uL (ref 0.0–0.5)
Eosinophils Relative: 1 %
HCT: 34.9 % — ABNORMAL LOW (ref 36.0–46.0)
Hemoglobin: 11 g/dL — ABNORMAL LOW (ref 12.0–15.0)
Immature Granulocytes: 5 %
Lymphocytes Relative: 18 %
Lymphs Abs: 2.1 10*3/uL (ref 0.7–4.0)
MCH: 30.4 pg (ref 26.0–34.0)
MCHC: 31.5 g/dL (ref 30.0–36.0)
MCV: 96.4 fL (ref 80.0–100.0)
Monocytes Absolute: 0.8 10*3/uL (ref 0.1–1.0)
Monocytes Relative: 7 %
Neutro Abs: 8.1 10*3/uL — ABNORMAL HIGH (ref 1.7–7.7)
Neutrophils Relative %: 69 %
Platelets: 385 10*3/uL (ref 150–400)
RBC: 3.62 MIL/uL — ABNORMAL LOW (ref 3.87–5.11)
RDW: 13.2 % (ref 11.5–15.5)
WBC: 11.7 10*3/uL — ABNORMAL HIGH (ref 4.0–10.5)
nRBC: 0 % (ref 0.0–0.2)

## 2018-09-13 LAB — HUMAN PARVOVIRUS DNA DETECTION BY PCR: Parvovirus B19, PCR: NEGATIVE

## 2018-09-15 NOTE — H&P (Signed)
Ms. Talerico is a 38 y.o. G77P0000 female for planned pLTCS for breech presentation. 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 Coli Sepsis. Diabetes insipidus developed.  Total 14 days IV antibiotics Ertapenem 1g daily with PICC line at home. Does have pending visit with Dr. Gabriel Carina to review her DI  Also discharged with Fluticasone nasal spray, Monteluast 10mg , Potassium Chloride, Pyroxide 100mg   Repeat US OB Today:        Last AFI was Afi=84 mm @ 12%. Afi=122 mm @ 43% Breech plac ant  Rt lat  Fhr=148 bpm   OB/GYN History:  OB History    Gravida  1   Para  0   Term  0   Preterm  0   AB  0   Living  0     SAB  0   TAB  0   Ectopic  0   Molar      Multiple  0   Live Births             Past Medical History:  has a past medical history of Anxiety and Depression.  Past Surgical History:  has no past surgical history on file. Family History: family history includes ALS in her mother. Social History:  reports that she has quit smoking. She has never used smokeless tobacco. She reports that she does not drink alcohol or use drugs.  Allergies: has No Known Allergies. Medications:  Current Outpatient Medications:  .  cetirizine (ZYRTEC) 10 mg capsule, Take 10 mg by mouth once daily., Disp: , Rfl:  .  cranberry fruit extract (CRANBERRY ORAL), Take by mouth, Disp: , Rfl:  .  KLOR-CON M20 20 mEq ER tablet, TAKE 2 TABLETS (40 MEQ TOTAL) BY MOUTH 3 (THREE) TIMES DAILY., Disp: , Rfl:  .  montelukast (SINGULAIR) 10 mg tablet, TAKE 1 TABLET BY MOUTH EVERY DAY IN THE EVENING, Disp: , Rfl: 3 .  multivitamin tablet, Take 1 tablet by mouth once daily,  Disp: , Rfl:  .  pyridoxine, vitamin B6, (VITAMIN B-6) 25 MG tablet, Take 1 tablet (25 mg total) by mouth 3 (three) times daily (Patient not taking: Reported on 08/24/2018  ), Disp: 90 tablet, Rfl: 1  Review of Systems: General:   No fatigue or weight loss Eyes:   No vision changes Ears:   No hearing difficulty Respiratory:                No cough or shortness of breath Pulmonary:   No asthma or shortness of breath Cardiovascular:        No chest pain, palpitations, dyspnea on exertion Gastrointestinal:          No abdominal bloating, chronic diarrhea, constipations, masses, pain or hematochezia Genitourinary:  No hematuria, dysuria, abnormal vaginal discharge, pelvic pain, Menometrorrhagia Lymphatic:  No swollen lymph nodes Musculoskeletal: No muscle weakness Neurologic:  No extremity weakness, syncope, seizure disorder Psychiatric:  No history of depression, delusions or suicidal/homicidal ideation    Exam:   BP 106/8 Fetal heart tones: 148 WDWN female in NAD   Lungs: CTA  CV : RRR without murmur   HEENT: Trachea midline, no LAD, no thyroidmegaly Abdomen: soft , no mass,  normal active bowel sounds,  non-tender, no rebound tenderness Pelvic: deferred   Impression:   The primary encounter diagnosis was Supervision of high risk pregnancy in third trimester. A diagnosis of Gestational diabetes insipidus (CMS-HCC) was also pertinent to this visit.    Plan:   I personally performed a full review of the patient's pregnancy records today.  -- Preoperative visit for primary cesarean section.  Patient counseled on risks, complications, and alternatives to surgery. Risks include bleeding, infection, pain, injury, no guarantee of outcome, anesthesia risks, thromboembolic risks and other unexpected events.  Paperwork filled in.  Cesarean section guide reviewed. Orders for lab studies written. Written education materials provided and informed consent obtained for the procedure.  No barriers  to learning.  Proceed with surgery as planned.  Patient instructed to call with questions or concerns prior to the surgery.Guidelines printed and handed to patient.   Cecilie KicksBETHANY EVANGELINE Sharod Petsch, MD

## 2018-09-19 ENCOUNTER — Encounter
Admission: RE | Admit: 2018-09-19 | Discharge: 2018-09-19 | Disposition: A | Discharge status: Home / Self Care | Payer: BC Managed Care – PPO | Source: Ambulatory Visit | Attending: Obstetrics and Gynecology | Admitting: Obstetrics and Gynecology

## 2018-09-19 ENCOUNTER — Other Ambulatory Visit: Payer: Self-pay

## 2018-09-19 DIAGNOSIS — Z01818 Encounter for other preprocedural examination: Secondary | ICD-10-CM | POA: Insufficient documentation

## 2018-09-19 NOTE — Patient Instructions (Signed)
Your procedure is scheduled on: Mon 8/3 at 10:50 Report to medical [facility]ot[facility]aMall    You will be escorted to L&D   Remember: Instructions that are not followed completely may result in serious medical risk,  up to and including death, or upon the discretion of your surgeon and anesthesiologist your  surgery may need to be rescheduled.     _X__ 1. Do not eat food after midnight the night before your procedure.                 No gum chewing or hard candies. You may drink clear liquids up to 2 hours                 before you are scheduled to arrive for your surgery- DO not drink clear                 liquids within 2 hours of the start of your surgery.                 Clear Liquids include:  water, apple juice without pulp,Clearfast of Gatorade, Black Coffee or Tea (Do not add                 anything to coffee or tea).       complete clear carbohydrate Drink  2 hours before you arrive   __X__2.  On the morning of surgery brush your teeth with toothpaste and water, you                may rinse your mouth with mouthwash if you wish.  Do not swallow any toothpaste of mouthwash.     ___ 3.  No Alcohol for 24 hours before or after surgery.   ___ 4.  Do Not Smoke or use e-cigarettes For 24 Hours Prior to Your Surgery.                 Do not use any chewable tobacco products for at least 6 hours prior to                 surgery.  ____  5.  Bring all medications with you on the day of surgery if instructed.   __x__  6.  Notify your doctor if there is any change in your medical condition      (cold, fever, infections).     Do not wear jewelry, make-up, hairpins, clips or nail polish. Do not wear lotions, powders, or perfumes. You may wear deodorant. Do not shave 48 hours prior to surgery. Men may shave face and neck. Do not bring valuables to the hospital.    [facility] is not responsible for any belongings or valuables.  Contacts, dentures or bridgework may not be worn into  surgery. Leave your suitcase in the car. After surgery it may be brought to your room. For patients admitted to the hospital, discharge time is determined by your treatment team.   Patients discharged the day of surgery will not be allowed to drive home.   Please read over the following fact sheets that you were given:    _x___ Take these medicines the morning of surgery with A SIP OF WATER:    1. acetaminophen (TYLENOL) 325 MG tablet  If needed  2. cetirizine (ZYRTEC) 10 MG tablet  3. fluticasone (VERAMYST) 27.5 MCG/SPRAY nasal spray  4.              5.  6.  ____ Fleet Enema (  as directed)   __x__ Use CHG Soap as directed  ____ Use inhalers on the day of surgery  ____ Stop metformin 2 days prior to surgery    ____ Take 1/2 of usual insulin dose the night before surgery. No insulin the morning          of surgery.   ____ Stop Coumadin/Plavix/aspirin on   ____ Stop Anti-inflammatories on    __x__ Stop supplements until after surgery.  cranberry  ____ Bring C-Pap to the hospital.

## 2018-09-20 ENCOUNTER — Telehealth: Payer: Self-pay | Admitting: Licensed Clinical Social Worker

## 2018-09-20 NOTE — Telephone Encounter (Signed)
Spoke with patient and she has completed her IV antibiotics and the PICC line was removed. She is also scheduled for a C-section on Monday 8/3.

## 2018-09-21 ENCOUNTER — Other Ambulatory Visit: Payer: Self-pay

## 2018-09-21 ENCOUNTER — Ambulatory Visit
Admission: RE | Admit: 2018-09-21 | Discharge: 2018-09-21 | Disposition: A | Discharge status: Home / Self Care | Payer: BC Managed Care – PPO | Source: Ambulatory Visit | Attending: Obstetrics and Gynecology | Admitting: Obstetrics and Gynecology

## 2018-09-21 DIAGNOSIS — Z20828 Contact with and (suspected) exposure to other viral communicable diseases: Secondary | ICD-10-CM | POA: Insufficient documentation

## 2018-09-21 LAB — SARS CORONAVIRUS 2 (TAT 6-24 HRS): SARS Coronavirus 2: NEGATIVE

## 2018-09-24 MED ORDER — CEFAZOLIN SODIUM-DEXTROSE 2-4 GM/100ML-% IV SOLN
2.0000 g | INTRAVENOUS | Status: AC
  Administered 2018-09-25: 14:00:00 2 g via INTRAVENOUS
  Filled 2018-09-24: qty 100

## 2018-09-25 ENCOUNTER — Inpatient Hospital Stay
Admission: RE | Admit: 2018-09-25 | Discharge: 2018-09-26 | DRG: 787 | Disposition: A | Discharge status: Other Acute Inpatient Hospital | Payer: BC Managed Care – PPO | Attending: Obstetrics and Gynecology | Admitting: Obstetrics and Gynecology

## 2018-09-25 ENCOUNTER — Encounter
Admission: RE | Disposition: A | Discharge status: Other Acute Inpatient Hospital | Payer: Self-pay | Source: Home / Self Care | Attending: Obstetrics and Gynecology

## 2018-09-25 ENCOUNTER — Other Ambulatory Visit: Payer: Self-pay

## 2018-09-25 ENCOUNTER — Inpatient Hospital Stay: Payer: BC Managed Care – PPO | Admitting: Anesthesiology

## 2018-09-25 DIAGNOSIS — O8620 Urinary tract infection following delivery, unspecified: Secondary | ICD-10-CM | POA: Diagnosis present

## 2018-09-25 DIAGNOSIS — Z87891 Personal history of nicotine dependence: Secondary | ICD-10-CM | POA: Diagnosis not present

## 2018-09-25 DIAGNOSIS — O99284 Endocrine, nutritional and metabolic diseases complicating childbirth: Secondary | ICD-10-CM | POA: Diagnosis present

## 2018-09-25 DIAGNOSIS — Z3A39 39 weeks gestation of pregnancy: Secondary | ICD-10-CM | POA: Diagnosis not present

## 2018-09-25 DIAGNOSIS — Z98891 History of uterine scar from previous surgery: Secondary | ICD-10-CM

## 2018-09-25 DIAGNOSIS — O321XX0 Maternal care for breech presentation, not applicable or unspecified: Principal | ICD-10-CM | POA: Diagnosis present

## 2018-09-25 DIAGNOSIS — O0993 Supervision of high risk pregnancy, unspecified, third trimester: Secondary | ICD-10-CM

## 2018-09-25 DIAGNOSIS — E232 Diabetes insipidus: Secondary | ICD-10-CM | POA: Diagnosis present

## 2018-09-25 LAB — BASIC METABOLIC PANEL
Anion gap: 10 (ref 5–15)
BUN: 12 mg/dL (ref 6–20)
CO2: 20 mmol/L — ABNORMAL LOW (ref 22–32)
Calcium: 8.7 mg/dL — ABNORMAL LOW (ref 8.9–10.3)
Chloride: 103 mmol/L (ref 98–111)
Creatinine, Ser: 0.67 mg/dL (ref 0.44–1.00)
GFR calc Af Amer: >60 mL/min (ref >60)
GFR calc non Af Amer: >60 mL/min (ref >60)
Glucose, Bld: 100 mg/dL — ABNORMAL HIGH (ref 70–99)
Potassium: 3.5 mmol/L (ref 3.5–5.1)
Sodium: 133 mmol/L — ABNORMAL LOW (ref 135–145)

## 2018-09-25 LAB — PROTIME-INR
INR: 1.1 (ref 0.8–1.2)
Prothrombin Time: 14.2 seconds (ref 11.4–15.2)

## 2018-09-25 LAB — URINALYSIS, ROUTINE W REFLEX MICROSCOPIC
Bilirubin Urine: NEGATIVE
Glucose, UA: NEGATIVE mg/dL
Ketones, ur: 20 mg/dL — AB
Nitrite: NEGATIVE
Protein, ur: 100 mg/dL — AB
RBC / HPF: >50 RBC/hpf — ABNORMAL HIGH (ref 0–5)
Specific Gravity, Urine: 1.014 (ref 1.005–1.030)
Squamous Epithelial / HPF: NONE SEEN (ref 0–5)
WBC, UA: >50 WBC/hpf — ABNORMAL HIGH (ref 0–5)
pH: 6 (ref 5.0–8.0)

## 2018-09-25 LAB — CBC
HCT: 37.1 % (ref 36.0–46.0)
HCT: 26.7 % — ABNORMAL LOW (ref 36.0–46.0)
Hemoglobin: 12 g/dL (ref 12.0–15.0)
Hemoglobin: 8.6 g/dL — ABNORMAL LOW (ref 12.0–15.0)
MCH: 30.2 pg (ref 26.0–34.0)
MCH: 30.4 pg (ref 26.0–34.0)
MCHC: 32.3 g/dL (ref 30.0–36.0)
MCHC: 32.2 g/dL (ref 30.0–36.0)
MCV: 93.2 fL (ref 80.0–100.0)
MCV: 94.3 fL (ref 80.0–100.0)
Platelets: 174 10*3/uL (ref 150–400)
Platelets: 149 10*3/uL — ABNORMAL LOW (ref 150–400)
RBC: 3.98 MIL/uL (ref 3.87–5.11)
RBC: 2.83 MIL/uL — ABNORMAL LOW (ref 3.87–5.11)
RDW: 13.3 % (ref 11.5–15.5)
RDW: 13.3 % (ref 11.5–15.5)
WBC: 12.4 10*3/uL — ABNORMAL HIGH (ref 4.0–10.5)
WBC: 13.5 10*3/uL — ABNORMAL HIGH (ref 4.0–10.5)
nRBC: 0 % (ref 0.0–0.2)
nRBC: 0 % (ref 0.0–0.2)

## 2018-09-25 LAB — TYPE AND SCREEN
ABO/RH(D): O POS
Antibody Screen: NEGATIVE

## 2018-09-25 LAB — APTT: aPTT: 30 seconds (ref 24–36)

## 2018-09-25 LAB — FIBRINOGEN: Fibrinogen: 485 mg/dL — ABNORMAL HIGH (ref 210–475)

## 2018-09-25 SURGERY — Surgical Case
Anesthesia: Spinal

## 2018-09-25 MED ORDER — LACTATED RINGERS IV SOLN
INTRAVENOUS | Status: DC
  Administered 2018-09-25: 12:00:00 via INTRAVENOUS

## 2018-09-25 MED ORDER — BUPIVACAINE IN DEXTROSE 0.75-8.25 % IT SOLN
INTRATHECAL | Status: DC | PRN
  Administered 2018-09-25: 1.6 mL via INTRATHECAL

## 2018-09-25 MED ORDER — PRENATAL MULTIVITAMIN CH: 1.0000 | ORAL_TABLET | Freq: Every day | ORAL | Status: DC

## 2018-09-25 MED ORDER — DIPHENHYDRAMINE HCL 25 MG PO CAPS: 25.0000 mg | ORAL_CAPSULE | Freq: Four times a day (QID) | ORAL | Status: DC | PRN

## 2018-09-25 MED ORDER — BUPIVACAINE HCL (PF) 0.5 % IJ SOLN
INTRAMUSCULAR | Status: AC
  Filled 2018-09-25: qty 30

## 2018-09-25 MED ORDER — SODIUM CHLORIDE 0.9 % IV SOLN
INTRAVENOUS | Status: DC | PRN
  Administered 2018-09-25: 14:00:00 40 [IU] via INTRAVENOUS

## 2018-09-25 MED ORDER — MEASLES, MUMPS & RUBELLA VAC IJ SOLR
0.5000 mL | Freq: Once | INTRAMUSCULAR | Status: DC
  Filled 2018-09-25: qty 0.5

## 2018-09-25 MED ORDER — SODIUM CHLORIDE 0.9 % IV SOLN
INTRAVENOUS | Status: DC | PRN
  Administered 2018-09-25 (×2): 50 ug/min via INTRAVENOUS

## 2018-09-25 MED ORDER — PHENYLEPHRINE HCL (PRESSORS) 10 MG/ML IV SOLN
INTRAVENOUS | Status: AC
  Filled 2018-09-25: qty 1

## 2018-09-25 MED ORDER — NALOXONE HCL 0.4 MG/ML IJ SOLN: 0.4000 mg | INTRAMUSCULAR | Status: DC | PRN

## 2018-09-25 MED ORDER — OXYCODONE HCL 5 MG PO TABS: 5.0000 mg | ORAL_TABLET | ORAL | Status: DC | PRN

## 2018-09-25 MED ORDER — WITCH HAZEL-GLYCERIN EX PADS: 1.0000 "application " | MEDICATED_PAD | CUTANEOUS | Status: DC | PRN

## 2018-09-25 MED ORDER — SENNOSIDES-DOCUSATE SODIUM 8.6-50 MG PO TABS
2.0000 | ORAL_TABLET | ORAL | Status: DC
  Administered 2018-09-26: 2 via ORAL
  Filled 2018-09-25: qty 2

## 2018-09-25 MED ORDER — DIPHENHYDRAMINE HCL 25 MG PO CAPS: 25.0000 mg | ORAL_CAPSULE | ORAL | Status: DC | PRN

## 2018-09-25 MED ORDER — ONDANSETRON HCL 4 MG/2ML IJ SOLN
INTRAMUSCULAR | Status: AC
  Filled 2018-09-25: qty 2

## 2018-09-25 MED ORDER — BUPIVACAINE HCL (PF) 0.5 % IJ SOLN
INTRAMUSCULAR | Status: DC | PRN
  Administered 2018-09-25: 30 mL

## 2018-09-25 MED ORDER — NALBUPHINE HCL 10 MG/ML IJ SOLN: 5.0000 mg | Freq: Once | INTRAMUSCULAR | Status: DC | PRN

## 2018-09-25 MED ORDER — COCONUT OIL OIL: 1.0000 "application " | TOPICAL_OIL | Status: DC | PRN

## 2018-09-25 MED ORDER — SOD CITRATE-CITRIC ACID 500-334 MG/5ML PO SOLN
ORAL | Status: AC
  Administered 2018-09-25: 30 mL
  Filled 2018-09-25: qty 30

## 2018-09-25 MED ORDER — MEPERIDINE HCL 25 MG/ML IJ SOLN
6.2500 mg | INTRAMUSCULAR | Status: AC | PRN
  Administered 2018-09-25 (×2): 6.25 mg via INTRAVENOUS
  Filled 2018-09-25 (×2): qty 1

## 2018-09-25 MED ORDER — DIPHENHYDRAMINE HCL 50 MG/ML IJ SOLN: 12.5000 mg | INTRAMUSCULAR | Status: DC | PRN

## 2018-09-25 MED ORDER — OXYCODONE HCL 5 MG PO TABS: 10.0000 mg | ORAL_TABLET | ORAL | Status: DC | PRN

## 2018-09-25 MED ORDER — ONDANSETRON HCL 4 MG/2ML IJ SOLN: 4.0000 mg | Freq: Three times a day (TID) | INTRAMUSCULAR | Status: DC | PRN

## 2018-09-25 MED ORDER — LACTATED RINGERS IV BOLUS
1000.0000 mL | Freq: Once | INTRAVENOUS | Status: AC
  Administered 2018-09-25: 1000 mL via INTRAVENOUS

## 2018-09-25 MED ORDER — ONDANSETRON HCL 4 MG/2ML IJ SOLN
4.0000 mg | Freq: Once | INTRAMUSCULAR | Status: AC
  Administered 2018-09-25: 12:00:00 4 mg via INTRAVENOUS

## 2018-09-25 MED ORDER — BUPIVACAINE LIPOSOME 1.3 % IJ SUSP: 20.0000 mL | Freq: Once | INTRAMUSCULAR | Status: DC

## 2018-09-25 MED ORDER — DIBUCAINE (PERIANAL) 1 % EX OINT: 1.0000 "application " | TOPICAL_OINTMENT | CUTANEOUS | Status: DC | PRN

## 2018-09-25 MED ORDER — NALBUPHINE HCL 10 MG/ML IJ SOLN: 5.0000 mg | INTRAMUSCULAR | Status: DC | PRN

## 2018-09-25 MED ORDER — OXYTOCIN 40 UNITS IN NORMAL SALINE INFUSION - SIMPLE MED
2.5000 [IU]/h | INTRAVENOUS | Status: DC
  Filled 2018-09-25: qty 1000

## 2018-09-25 MED ORDER — BISACODYL 10 MG RE SUPP: 10.0000 mg | Freq: Every day | RECTAL | Status: DC | PRN

## 2018-09-25 MED ORDER — SIMETHICONE 80 MG PO CHEW
80.0000 mg | CHEWABLE_TABLET | ORAL | Status: DC
  Administered 2018-09-26: 80 mg via ORAL
  Filled 2018-09-25: qty 1

## 2018-09-25 MED ORDER — ACETAMINOPHEN 325 MG PO TABS
650.0000 mg | ORAL_TABLET | Freq: Four times a day (QID) | ORAL | Status: DC
  Administered 2018-09-25 (×2): 650 mg via ORAL
  Filled 2018-09-25 (×2): qty 2

## 2018-09-25 MED ORDER — ONDANSETRON HCL 4 MG/2ML IJ SOLN
INTRAMUSCULAR | Status: AC
  Administered 2018-09-25: 12:00:00 4 mg via INTRAVENOUS
  Filled 2018-09-25: qty 2

## 2018-09-25 MED ORDER — ONDANSETRON HCL 4 MG/2ML IJ SOLN
INTRAMUSCULAR | Status: DC | PRN
  Administered 2018-09-25: 4 mg via INTRAVENOUS

## 2018-09-25 MED ORDER — PROPOFOL 10 MG/ML IV BOLUS
INTRAVENOUS | Status: AC
  Filled 2018-09-25: qty 20

## 2018-09-25 MED ORDER — METHYLERGONOVINE MALEATE 0.2 MG/ML IJ SOLN
INTRAMUSCULAR | Status: AC
  Filled 2018-09-25: qty 1

## 2018-09-25 MED ORDER — FENTANYL CITRATE (PF) 100 MCG/2ML IJ SOLN
INTRAMUSCULAR | Status: AC
  Filled 2018-09-25: qty 2

## 2018-09-25 MED ORDER — SIMETHICONE 80 MG PO CHEW: 80.0000 mg | CHEWABLE_TABLET | Freq: Three times a day (TID) | ORAL | Status: DC

## 2018-09-25 MED ORDER — IBUPROFEN 800 MG PO TABS: 800.0000 mg | ORAL_TABLET | Freq: Four times a day (QID) | ORAL | Status: DC

## 2018-09-25 MED ORDER — FERROUS SULFATE 325 (65 FE) MG PO TABS: 325.0000 mg | ORAL_TABLET | Freq: Two times a day (BID) | ORAL | Status: DC

## 2018-09-25 MED ORDER — PHENYLEPHRINE HCL (PRESSORS) 10 MG/ML IV SOLN
INTRAVENOUS | Status: DC | PRN
  Administered 2018-09-25: 100 ug via INTRAVENOUS

## 2018-09-25 MED ORDER — LACTATED RINGERS IV SOLN: INTRAVENOUS | Status: DC

## 2018-09-25 MED ORDER — KETOROLAC TROMETHAMINE 30 MG/ML IJ SOLN
30.0000 mg | Freq: Four times a day (QID) | INTRAMUSCULAR | Status: DC
  Administered 2018-09-25 (×2): 30 mg via INTRAVENOUS
  Filled 2018-09-25 (×2): qty 1

## 2018-09-25 MED ORDER — MORPHINE SULFATE (PF) 0.5 MG/ML IJ SOLN
INTRAMUSCULAR | Status: AC
  Filled 2018-09-25: qty 10

## 2018-09-25 MED ORDER — MENTHOL 3 MG MT LOZG
1.0000 | LOZENGE | OROMUCOSAL | Status: DC | PRN
  Filled 2018-09-25: qty 9

## 2018-09-25 MED ORDER — OXYTOCIN 40 UNITS IN NORMAL SALINE INFUSION - SIMPLE MED
INTRAVENOUS | Status: AC
  Filled 2018-09-25: qty 1000

## 2018-09-25 MED ORDER — GABAPENTIN 300 MG PO CAPS
300.0000 mg | ORAL_CAPSULE | Freq: Every day | ORAL | Status: DC
  Administered 2018-09-26: 300 mg via ORAL
  Filled 2018-09-25: qty 1

## 2018-09-25 MED ORDER — MONTELUKAST SODIUM 10 MG PO TABS
10.0000 mg | ORAL_TABLET | Freq: Every day | ORAL | Status: DC
  Filled 2018-09-25: qty 1

## 2018-09-25 MED ORDER — SODIUM CHLORIDE 0.9 % IV SOLN
INTRAVENOUS | Status: DC | PRN
  Administered 2018-09-25: 60 mL

## 2018-09-25 MED ORDER — BUPIVACAINE LIPOSOME 1.3 % IJ SUSP
INTRAMUSCULAR | Status: AC
  Filled 2018-09-25: qty 20

## 2018-09-25 MED ORDER — MORPHINE SULFATE (PF) 0.5 MG/ML IJ SOLN
INTRAMUSCULAR | Status: DC | PRN
  Administered 2018-09-25: .1 mg via INTRATHECAL

## 2018-09-25 MED ORDER — SODIUM CHLORIDE 0.9% FLUSH: 3.0000 mL | INTRAVENOUS | Status: DC | PRN

## 2018-09-25 MED ORDER — ACETAMINOPHEN 500 MG PO TABS: 1000.0000 mg | ORAL_TABLET | Freq: Four times a day (QID) | ORAL | Status: DC

## 2018-09-25 MED ORDER — SODIUM CHLORIDE (PF) 0.9 % IJ SOLN
INTRAMUSCULAR | Status: AC
  Filled 2018-09-25: qty 50

## 2018-09-25 MED ORDER — FENTANYL CITRATE (PF) 100 MCG/2ML IJ SOLN
INTRAMUSCULAR | Status: DC | PRN
  Administered 2018-09-25: 15 ug via INTRATHECAL

## 2018-09-25 MED ORDER — KETOROLAC TROMETHAMINE 30 MG/ML IJ SOLN: 30.0000 mg | Freq: Four times a day (QID) | INTRAMUSCULAR | Status: DC

## 2018-09-25 MED ORDER — FLEET ENEMA 7-19 GM/118ML RE ENEM: 1.0000 | ENEMA | Freq: Every day | RECTAL | Status: DC | PRN

## 2018-09-25 MED ORDER — TETANUS-DIPHTH-ACELL PERTUSSIS 5-2.5-18.5 LF-MCG/0.5 IM SUSP: 0.5000 mL | Freq: Once | INTRAMUSCULAR | Status: DC

## 2018-09-25 MED ORDER — SIMETHICONE 80 MG PO CHEW: 80.0000 mg | CHEWABLE_TABLET | ORAL | Status: DC | PRN

## 2018-09-25 SURGICAL SUPPLY — 28 items
BARRIER ADHS 3X4 INTERCEED (GAUZE/BANDAGES/DRESSINGS) ×2 IMPLANT
CANISTER SUCT 3000ML PPV (MISCELLANEOUS) ×3 IMPLANT
CHLORAPREP W/TINT 26 (MISCELLANEOUS) ×3 IMPLANT
COVER WAND RF STERILE (DRAPES) ×3 IMPLANT
DRSG OPSITE POSTOP 4X10 (GAUZE/BANDAGES/DRESSINGS) ×2 IMPLANT
DRSG TELFA 3X8 NADH (GAUZE/BANDAGES/DRESSINGS) IMPLANT
ELECT REM PT RETURN 9FT ADLT (ELECTROSURGICAL) ×3
ELECTRODE REM PT RTRN 9FT ADLT (ELECTROSURGICAL) ×1 IMPLANT
GAUZE SPONGE 4X4 12PLY STRL (GAUZE/BANDAGES/DRESSINGS) ×1 IMPLANT
GOWN STRL REUS W/ TWL LRG LVL3 (GOWN DISPOSABLE) ×3 IMPLANT
GOWN STRL REUS W/TWL LRG LVL3 (GOWN DISPOSABLE) ×8
NDL HYPO 25GX1X1/2 BEV (NEEDLE) ×1 IMPLANT
NEEDLE HYPO 25GX1X1/2 BEV (NEEDLE) ×3 IMPLANT
NS IRRIG 1000ML POUR BTL (IV SOLUTION) ×3 IMPLANT
PACK C SECTION AR (MISCELLANEOUS) ×3 IMPLANT
PAD DRESSING TELFA 3X8 NADH (GAUZE/BANDAGES/DRESSINGS) ×1 IMPLANT
PAD OB MATERNITY 4.3X12.25 (PERSONAL CARE ITEMS) ×3 IMPLANT
PAD PREP 24X41 OB/GYN DISP (PERSONAL CARE ITEMS) ×3 IMPLANT
PENCIL SMOKE ULTRAEVAC 22 CON (MISCELLANEOUS) ×3 IMPLANT
SUT MNCRL 4-0 (SUTURE) ×2
SUT MNCRL 4-0 27XMFL (SUTURE) ×1
SUT VIC AB 0 CT1 36 (SUTURE) ×6 IMPLANT
SUT VIC AB 0 CTX 36 (SUTURE) ×4
SUT VIC AB 0 CTX36XBRD ANBCTRL (SUTURE) ×2 IMPLANT
SUT VIC AB 2-0 SH 27 (SUTURE) ×4
SUT VIC AB 2-0 SH 27XBRD (SUTURE) ×2 IMPLANT
SUTURE MNCRL 4-0 27XMF (SUTURE) ×1 IMPLANT
SYR 30ML LL (SYRINGE) ×6 IMPLANT

## 2018-09-25 NOTE — Discharge Instructions (Signed)

## 2018-09-25 NOTE — Anesthesia Preprocedure Evaluation (Signed)
Anesthesia Evaluation  Patient identified by MRN, date of birth, ID band Patient awake    Reviewed: Allergy & Precautions, NPO status , Patient's Chart, lab work & pertinent test results  History of Anesthesia Complications Negative for: history of anesthetic complications  Airway Mallampati: II  TM Distance: >3 FB Neck ROM: Full    Dental no notable dental hx.    Pulmonary neg pulmonary ROS, neg sleep apnea, neg COPD, former smoker,    breath sounds clear to auscultation- rhonchi (-) wheezing      Cardiovascular Exercise Tolerance: Good (-) hypertension(-) CAD and (-) Past MI  Rhythm:Regular Rate:Normal - Systolic murmurs and - Diastolic murmurs    Neuro/Psych negative neurological ROS  negative psych ROS   GI/Hepatic negative GI ROS, Neg liver ROS,   Endo/Other  negative endocrine ROSneg diabetes  Renal/GU negative Renal ROS     Musculoskeletal negative musculoskeletal ROS (+)   Abdominal (+) - obese, Gravid abdomen  Peds  Hematology negative hematology ROS (+)   Anesthesia Other Findings Past Medical History: No date: Infection     Comment:  Current UTI January 2020 03/23/2018: Urinary tract infection due to extended-spectrum beta  lactamase (ESBL) producing Escherichia coli No date: Vaginal Pap smear, abnormal     Comment:  HPV   Reproductive/Obstetrics (+) Pregnancy                             Lab Results  Component Value Date   WBC 11.7 (H) 09/11/2018   HGB 11.0 (L) 09/11/2018   HCT 34.9 (L) 09/11/2018   MCV 96.4 09/11/2018   PLT 385 09/11/2018    Anesthesia Physical Anesthesia Plan  ASA: II  Anesthesia Plan: Spinal   Post-op Pain Management:    Induction:   PONV Risk Score and Plan: 2 and Ondansetron  Airway Management Planned: Natural Airway  Additional Equipment:   Intra-op Plan:   Post-operative Plan:   Informed Consent: I have reviewed the patients  History and Physical, chart, labs and discussed the procedure including the risks, benefits and alternatives for the proposed anesthesia with the patient or authorized representative who has indicated his/her understanding and acceptance.     Dental advisory given  Plan Discussed with: CRNA and Anesthesiologist  Anesthesia Plan Comments:         Anesthesia Quick Evaluation

## 2018-09-25 NOTE — Anesthesia Procedure Notes (Signed)
Date/Time: 09/25/2018 1:45 PM Performed by: Allean Found, CRNA Pre-anesthesia Checklist: Patient identified, Emergency Drugs available, Suction available, Patient being monitored and Timeout performed Oxygen Delivery Method: Nasal cannula Placement Confirmation: positive ETCO2

## 2018-09-25 NOTE — Anesthesia Procedure Notes (Addendum)
Spinal  Patient location during procedure: OR Start time: 09/25/2018 1:33 PM End time: 09/25/2018 1:41 PM Staffing Anesthesiologist: Emmie Niemann, MD Resident/CRNA: Allean Found, CRNA Performed: resident/CRNA  Preanesthetic Checklist Completed: patient identified, site marked, surgical consent, pre-op evaluation, timeout performed, IV checked, risks and benefits discussed and monitors and equipment checked Spinal Block Patient position: sitting Prep: ChloraPrep Patient monitoring: heart rate, continuous pulse ox and blood pressure Approach: midline Location: L4-5 Injection technique: single-shot Needle Needle type: Introducer and Pencil-Tip  Needle gauge: 24 G Needle length: 9 cm Assessment Sensory level: T4 Additional Notes Negative paresthesia. Negative blood return. Positive free-flowing CSF. Expiration date of kit checked and confirmed. Patient tolerated procedure well, without complications.

## 2018-09-25 NOTE — Transfer of Care (Signed)
Immediate Anesthesia Transfer of Care Note  Patient: Mariah [name]  Procedure(s) Performed: CESAREAN SECTION (N/A )  Patient Location: PACU  Anesthesia Type:Spinal  Level of Consciousness: awake and alert   Airway & Oxygen Therapy: Patient Spontanous Breathing  Post-op Assessment: Report given to RN and Post -op Vital signs reviewed and stable  Post vital signs: Reviewed and stable  Last Vitals:  Vitals Value Taken Time  BP    Temp    Pulse    Resp    SpO2      Last Pain:  Vitals:   09/25/18 1307  TempSrc: Oral  PainSc:          Complications: No apparent anesthesia complications

## 2018-09-25 NOTE — Anesthesia Post-op Follow-up Note (Signed)
Anesthesia QCDR form completed.        

## 2018-09-25 NOTE — Progress Notes (Addendum)
Called to bedside for increased and persistent bleeding from incision. Dressing was saturated per RN and changed with pressure dressing.  Now that seems to be bleeding through.  Pt denies pain or feeling lightheaded/dizzy/weak.  BP (!) 101/58 (BP Location: Right Arm)   Pulse (!) 101   Temp 98 F (36.7 C) (Oral)   Resp 18   Ht 5\' 10"  (1.778 m)   Wt 91.6 kg   LMP 12/26/2017   SpO2 97%   Breastfeeding Unknown   BMI 28.98 kg/m    Removed dressing and scant thin bloody fluid draining from incision with probe.  Sutures intact.  No areas of induration or edema.    Abdominal binder placed over ABD pads for tamponade.  To remain overnight without interruption.    Patient tolerated well. Dr. Leafy Ro aware.  ----- Larey Days, MD, [place] Attending Obstetrician and Gynecologist [facility], Department of [facility]   Addendum:  Spoke with Dr. Gerre Pebbles who accepted transfer.   Discussed  -hx urosepsis from  MDR e.coli bacteremia @ 35-36wks -transient DI during sepsis -abdominal binder for incisional ooze. -covid negative testing on 7/30 prior to quarantine @ home.  Will have nursing arrange transfer.  ----- Larey Days, MD, [place] Attending Obstetrician and Gynecologist [facility], Department of [facility]

## 2018-09-25 NOTE — Interval H&P Note (Signed)
History and Physical Interval Note:  09/25/2018 1:23 PM  Mariah [name]  has presented today for surgery, with the diagnosis of Breech Presentation.  The various methods of treatment have been discussed with the patient and family. After consideration of risks, benefits and other options for treatment, the patient has consented to  Procedure(s): CESAREAN SECTION (N/A) as a surgical intervention.  The patient's history has been reviewed, patient examined, no change in status, stable for surgery.  I have reviewed the patient's chart and labs.  Questions were answered to the patient's satisfaction.    She began having back pain last night that is similar to her pain with E.coli sepsis. She does not have a fever yet and her WBC today is 12. We will complete her surgery and plan for blood and urine cultures, with empiric treatment. Renal consult as reasonable.   Benjaman Kindler

## 2018-09-25 NOTE — Op Note (Addendum)
  Cesarean Section Procedure Note  Date of procedure: 09/25/2018   Pre-operative Diagnosis: Intrauterine pregnancy at [redacted]w[redacted]d;  - breech presentation - recent E. Coli sepsis - AMA  Post-operative Diagnosis: same, delivered.  Procedure: Primary Low Transverse Cesarean Section through Pfannenstiel incision  Surgeon: Benjaman Kindler  Assistant(s):  Dr. Leonides Schanz  Anesthesia: Spinal anesthesia  Estimated Blood Loss:  800         Total IV Fluids: see anesthsia's report  Urine Output: see anesthesia's report         Specimens: cord blood and placenta         Complications:  None; patient tolerated the procedure well.         Disposition: PACU - hemodynamically stable.         Condition: stable  Findings:  A female infant "Belenda Cruise" in cephalic presentation. Amniotic fluid - Clear  Birth weight 3590 g.  Apgars of 8 and 9 at one and five minutes respectively.  Intact placenta with a three-vessel cord.  Grossly normal uterus, tubes and ovaries bilaterally. No intraabdominal adhesions were noted.  Indications: breech  Procedure Details  The patient was taken to Operating Room, identified as the correct patient and the procedure verified as C-Section Delivery. A formal Time Out was held with all team members present and in agreement.  After induction of spinal anesthesia, the patient was draped and prepped in the usual sterile manner. A Pfannenstiel skin incision was made and carried down through the subcutaneous tissue to the fascia. Fascial incision was made and extended transversely with the Mayo scissors. The fascia was separated from the underlying rectus tissue superiorly and inferiorly. The peritoneum was identified and entered bluntly. Peritoneal incision was extended longitudinally. The utero-vesical peritoneal reflection was incised transversely and a bladder flap was created digitally.   A low transverse hysterotomy was made. The fetus was delivered in a breech presentation  atraumatically, using standard breech manuvers. The umbilical cord was clamped x2 and cut and the infant was handed to the awaiting pediatricians. The placenta was removed intact and appeared normal, intact, and with a 3-vessel cord.   The uterus was exteriorized and cleared of all clot and debris. The hysterotomy was closed with running sutures of 0-Vicryl. A second imbricating layer was placed with the same suture. Hemostasis was observed. However, significant uterine atony was noted, and bleeding from the sutures were noted. The imbricating layer helped, and 0.2mg  methergine was placed IM into the uterus. The peritoneal cavity was cleared of all clots and debris. The uterus was returned to the abdomen.   The pelvis was evaluated xcellent hemostasis was noted. The fascia was then reapproximated with running sutures of 0 Vicryl. The subcutaneous tissue was reapproximated with running sutures of 0 Vicry. The skin was reapproximated with a 4-0 Monocryl subcuticular stitch. 16ml (in 30 of 0.5% bupivicaine and 22ml of NSS) of liposomal bupivicaine placed in the fascial and skin lines. Instrument, sponge, and needle counts were correct prior to the abdominal closure and at the conclusion of the case.   The patient tolerated the procedure well and was transferred to the recovery room in stable condition.   Benjaman Kindler, MD 09/25/2018

## 2018-09-25 NOTE — Progress Notes (Signed)
Because pt feeling increased back pain, sending for urine culture from cath and blood cultures x2. Low threshold to start zosyn or meropenem based on prior cultures.

## 2018-09-26 LAB — BLOOD CULTURE ID PANEL (REFLEXED)

## 2018-09-26 MED ORDER — TETANUS-DIPHTH-ACELL PERTUSSIS 5-2.5-18.5 LF-MCG/0.5 IM SUSP: 0.5000 mL | Freq: Once | INTRAMUSCULAR | 0 refills | Status: AC

## 2018-09-26 MED ORDER — FERROUS SULFATE 325 (65 FE) MG PO TABS: 325.0000 mg | ORAL_TABLET | Freq: Two times a day (BID) | ORAL | 3 refills | Status: DC

## 2018-09-26 MED ORDER — SODIUM CHLORIDE 0.9 % IV SOLN
1.0000 g | Freq: Three times a day (TID) | INTRAVENOUS | Status: DC
  Administered 2018-09-26: 1 g via INTRAVENOUS
  Filled 2018-09-26 (×2): qty 1

## 2018-09-26 MED ORDER — OXYCODONE HCL 5 MG PO TABS: 5.0000 mg | ORAL_TABLET | ORAL | 0 refills | Status: AC | PRN

## 2018-09-26 MED ORDER — MEASLES, MUMPS & RUBELLA VAC IJ SOLR: 0.5000 mL | Freq: Once | INTRAMUSCULAR | 0 refills | Status: DC

## 2018-09-26 MED ORDER — IBUPROFEN 800 MG PO TABS: 800.0000 mg | ORAL_TABLET | Freq: Four times a day (QID) | ORAL | 1 refills | Status: AC

## 2018-09-26 NOTE — Progress Notes (Signed)
PHARMACY - PHYSICIAN COMMUNICATION CRITICAL VALUE ALERT - BLOOD CULTURE IDENTIFICATION (BCID)  Mariah [name] is an 38 y.o. female who presented to [place] on 09/21/2018.   Assessment: Bcx 2 of 4 GNR. BCID showing E.Coli. Patient has PMH of ESBL bacteremia. Patient transferred to [facility] on 8/4  Name of physician (or Provider) Contacted: Dr. Avram @ [facility]  Recommendations: Spoke with Dr. Avram at [facility] about patient's history of ESBL UTI and Bacteremia. Recommended restarted meropenem. MD stated she would start meropenem and consult ID.         Results for orders placed or performed during the hospital encounter of 09/01/18  Blood Culture ID Panel (Reflexed) (Collected: 09/02/2018 12:22 PM)  Result Value Ref Range   Enterococcus species NOT DETECTED NOT DETECTED   Listeria monocytogenes NOT DETECTED NOT DETECTED   Staphylococcus species NOT DETECTED NOT DETECTED   Staphylococcus aureus (BCID) NOT DETECTED NOT DETECTED   Streptococcus species NOT DETECTED NOT DETECTED   Streptococcus agalactiae NOT DETECTED NOT DETECTED   Streptococcus pneumoniae NOT DETECTED NOT DETECTED   Streptococcus pyogenes NOT DETECTED NOT DETECTED   Acinetobacter baumannii NOT DETECTED NOT DETECTED   Enterobacteriaceae species DETECTED (A) NOT DETECTED   Enterobacter cloacae complex NOT DETECTED NOT DETECTED   Escherichia coli DETECTED (A) NOT DETECTED   Klebsiella oxytoca NOT DETECTED NOT DETECTED   Klebsiella pneumoniae NOT DETECTED NOT DETECTED   Proteus species NOT DETECTED NOT DETECTED   Serratia marcescens NOT DETECTED NOT DETECTED   Carbapenem resistance NOT DETECTED NOT DETECTED   Haemophilus influenzae NOT DETECTED NOT DETECTED   Neisseria meningitidis NOT DETECTED NOT DETECTED   Pseudomonas aeruginosa NOT DETECTED NOT DETECTED   Candida albicans NOT DETECTED NOT DETECTED   Candida glabrata NOT DETECTED NOT DETECTED   Candida krusei NOT DETECTED NOT  DETECTED   Candida parapsilosis NOT DETECTED NOT DETECTED   Candida tropicalis NOT DETECTED NOT DETECTED    Oswell Say M Curlie Sittner, PharmD, BCPS Clinical Pharmacist 09/26/2018 5:07 AM    

## 2018-09-26 NOTE — Progress Notes (Signed)
PHARMACY - PHYSICIAN COMMUNICATION CRITICAL VALUE ALERT - BLOOD CULTURE IDENTIFICATION (BCID)  Mariah [name] is an 38 y.o. female who presented to [facility] on 09/21/2018.   Assessment: Bcx 2 of 4 GNR. BCID showing E.Coli. Patient has PMH of ESBL bacteremia. Patient transferred to [facility] on 8/4  Name of physician (or Provider) Contacted: Dr. Laure Kidney @ [facility]  Recommendations: Spoke with Dr. Laure Kidney at [facility] about patient's history of ESBL UTI and Bacteremia. Recommended restarted meropenem. MD stated she would start meropenem and consult ID.         Results for orders placed or performed during the hospital encounter of 09/01/18  Blood Culture ID Panel (Reflexed) (Collected: 09/02/2018 12:22 PM)  Result Value Ref Range   Enterococcus species NOT DETECTED NOT DETECTED   Listeria monocytogenes NOT DETECTED NOT DETECTED   Staphylococcus species NOT DETECTED NOT DETECTED   Staphylococcus aureus (BCID) NOT DETECTED NOT DETECTED   Streptococcus species NOT DETECTED NOT DETECTED   Streptococcus agalactiae NOT DETECTED NOT DETECTED   Streptococcus pneumoniae NOT DETECTED NOT DETECTED   Streptococcus pyogenes NOT DETECTED NOT DETECTED   Acinetobacter baumannii NOT DETECTED NOT DETECTED   Enterobacteriaceae species DETECTED (A) NOT DETECTED   Enterobacter cloacae complex NOT DETECTED NOT DETECTED   Escherichia coli DETECTED (A) NOT DETECTED   Klebsiella oxytoca NOT DETECTED NOT DETECTED   Klebsiella pneumoniae NOT DETECTED NOT DETECTED   Proteus species NOT DETECTED NOT DETECTED   Serratia marcescens NOT DETECTED NOT DETECTED   Carbapenem resistance NOT DETECTED NOT DETECTED   Haemophilus influenzae NOT DETECTED NOT DETECTED   Neisseria meningitidis NOT DETECTED NOT DETECTED   Pseudomonas aeruginosa NOT DETECTED NOT DETECTED   Candida albicans NOT DETECTED NOT DETECTED   Candida glabrata NOT DETECTED NOT DETECTED   Candida krusei NOT DETECTED NOT  DETECTED   Candida parapsilosis NOT DETECTED NOT DETECTED   Candida tropicalis NOT DETECTED NOT DETECTED    Pernell Dupre, PharmD, BCPS Clinical Pharmacist 09/26/2018 5:07 AM

## 2018-09-26 NOTE — Discharge Summary (Signed)
Obstetrical Discharge Summary  Patient Name: Mariah [name] DOB: [birth year] MRN: [id number]  Date of Admission: 09/25/2018 Date of Delivery: 09/25/2018 Delivered WU:[id number]:Bethany Dalbert GarnetBeasley, MD Date of Discharge: 09/26/2018  [facility]  NWG:[id number]:Patient's last menstrual period was 12/26/2017. EDC Estimated Date of Delivery: 10/02/18 Gestational Age at Delivery: 8520w0d   Antepartum complications:   1. 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 AMA 3. Breech presentation 4. 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   5. Hypokalemia, taking Kdur daily. 6. Allergies/Asthma - takes singulair daily  Admitting Diagnosis: Breech for scheduled cesarean Secondary Diagnosis: Patient Active Problem List   Diagnosis Date Noted  . Breech presentation, no version 09/25/2018  . Supervision of high risk pregnancy in third trimester 09/25/2018  . Thrombocytopenia (HCC) 09/03/2018  . SIRS (systemic inflammatory response syndrome) (HCC) 09/03/2018  . Bacteremia due to Escherichia coli 09/03/2018  . Sepsis due to Escherichia coli (HCC) 09/03/2018  . Back pain complicating pregnancy 09/01/2018  . Urinary tract infection due to extended-spectrum beta lactamase (ESBL) producing Escherichia coli 03/23/2018    Augmentation: n/a Complications: none apparent Intrapartum complications/course: routine scheduled cesarean  Delivery Type: primary cesarean section, low transverse incision Anesthesia: epidural Placenta: spontaneous Laceration:  n/a Episiotomy: n/a Newborn Data: Live born female "Natalia LeatherwoodKatherine"  Birth Weight: 7 lb 14.6 oz (3590 g) APGAR: 8, 9  Newborn Delivery   Birth date/time: 09/25/2018 14:08:00 Delivery type: C-Section, Low Transverse Trial of labor: No C-section categorization: Primary      Postpartum Procedures:  Meropenem 1g  IV  Post partum course:  Patient had a short postpartum course before [facility] to newborn being transferred there for bowel obstruction.  Patient is showing early signes of recurrent urinary tract infection, and was given 1 dose of 1g meropenem prior to transfer.  She was also oozing from her abdominal incision, and an abdominal binder was placed to provide tamponade x 24 hours.   By time of transfer she was tolerating PO  Discharge Physical Exam:  BP (!) 98/57 (BP Location: Right Arm) Comment: nurse notified  Pulse 99   Temp 98.5 F (36.9 C) (Oral)   Resp 18   Ht 5\' 10"  (1.778 m)   Wt 91.6 kg   LMP 12/26/2017   SpO2 97%   Breastfeeding Unknown   BMI 28.98 kg/m   General: NAD CV: RRR Pulm: CTABL, nl effort ABD: s/nd/nt, fundus firm and below the umbilicus Lochia: moderate Incision: - covered by abdominal binder DVT Evaluation: LE non-ttp, no evidence of DVT on exam.  Hemoglobin  Date Value Ref Range Status  09/25/2018 8.6 (L) 12.0 - 15.0 g/dL Final    Comment:    REPEATED TO VERIFY   HCT  Date Value Ref Range Status  09/25/2018 26.7 (L) 36.0 - 46.0 % Final     Disposition: stable, [facility] Feeding: breastmilk intended Baby Disposition[facility]  Rh Immune globulin given: n/a Rubella vaccine given: n/a Flu vaccine given in AP or PP setting: AP  Contraception: TBD  Prenatal Labs:   Blood type/Rh O pos  Antibody screen neg  Rubella Immune  Varicella Immune  RPR NR  HBsAg Neg  HIV NR  GC neg  Chlamydia neg  Genetic screening Negative, NIPT  1 hour GTT 101  3 hour GTT   GBS neg  Plan:  Mariah [name] was discharged to home in good condition. Follow-up appointment with Dr. Leafy Ro in 6 weeks.  Discharge Medications: Allergies as of 09/26/2018   No Known Allergies     Medication List    STOP taking these medications   ertapenem 1,000 mg in sodium chloride 0.9 % 100  mL   potassium chloride SA 20 MEQ tablet Commonly known as: K-DUR     TAKE these medications   acetaminophen 325 MG tablet Commonly known as: TYLENOL Take 650 mg by mouth every 6 (six) hours as needed for moderate pain.   cetirizine 10 MG tablet Commonly known as: ZYRTEC Take 10 mg by mouth daily.   CHILDRENS MULTIVITAMIN PO Take 1 tablet by mouth daily.   ferrous sulfate 325 (65 FE) MG tablet Take 1 tablet (325 mg total) by mouth 2 (two) times daily with a meal.   fluticasone 27.5 MCG/SPRAY nasal spray Commonly known as: VERAMYST Place 2 sprays into the nose daily as needed for allergies.   ibuprofen 800 MG tablet Commonly known as: ADVIL Take 1 tablet (800 mg total) by mouth every 6 (six) hours. Start taking on: September 27, 2018   montelukast 10 MG tablet Commonly known as: SINGULAIR Take 10 mg by mouth at bedtime.   oxyCODONE 5 MG immediate release tablet Commonly known as: Oxy IR/ROXICODONE Take 1 tablet (5 mg total) by mouth every 4 (four) hours as needed for moderate pain.   Tdap 5-2.5-18.5 LF-MCG/0.5 injection Commonly known as: BOOSTRIX Inject 0.5 mLs into the muscle once for 1 dose.            Discharge Care Instructions  (From admission, onward)         Start     Ordered   09/26/18 0000  Discharge wound care:    Comments: Keep incision dry, clean.   09/26/18 0135          Follow-up Information    Benjaman Kindler, MD In 2 weeks.   Specialty: Obstetrics and Gynecology Why: For postop check, can be video Contact information: [address] [place] [address] [phone]           Signed: ----- Larey Days, MD, [place] Attending Obstetrician and Gynecologist [facility], Department of [facility]

## 2018-09-26 NOTE — Anesthesia Postprocedure Evaluation (Signed)
Anesthesia Post Note  Patient: Mariah [name]  Procedure(s) Performed: CESAREAN SECTION (N/A )  Anesthesia Type: Spinal Anesthetic complications: no Comments: Pt transferred overnight with baby to another facility. No reported complications     Last Vitals:  Vitals:   09/26/18 0217 09/26/18 0238  BP: 104/69 110/82  Pulse: 94 92  Resp: 20 18  Temp: 36.7 C 36.7 C  SpO2: 97% 99%    Last Pain:  Vitals:   09/26/18 0238  TempSrc: Oral  PainSc:                  Lanora Manis

## 2018-09-26 NOTE — Progress Notes (Addendum)
Informed that neonate has a bowel obstruction and is being transferred to [facility] NICU  UA returned suspicious for infection ; starting Meropenem.  Trying to transfer patient to [facility] to be with neonate.  Speaking to [facility] transfer team now.    ----- Larey Days, MD, Bright Attending Obstetrician and Gynecologist [facility], Department of [place]

## 2018-09-26 NOTE — Progress Notes (Signed)
   09/26/18 0100  Clinical Encounter Type  Visited With Patient and family together  Visit Type Initial  Referral From Nurse  Consult/Referral To Chaplain  Spiritual Encounters  Spiritual Needs Prayer;Emotional;Other (Comment)  Stress Factors  Patient Stress Factors Major life changes  Family Stress Factors Major life changes  Chaplain received page to comfort patient and husband, their baby "Belenda Cruise" is being transport to [facility]. Patient and husband are concerned, but they are faith believers and ask the Chaplain to pray for the baby. Chaplain prayed  and offered encouraging words to patient and husband.

## 2018-09-27 LAB — SURGICAL PATHOLOGY

## 2018-09-28 LAB — CULTURE, BLOOD (ROUTINE X 2)

## 2018-09-28 LAB — URINE CULTURE: Culture: >=100000 — AB

## 2018-09-30 LAB — CULTURE, BLOOD (ROUTINE X 2)
Culture: NO GROWTH
Special Requests: ADEQUATE

## 2018-10-02 ENCOUNTER — Other Ambulatory Visit
Admission: RE | Admit: 2018-10-02 | Discharge: 2018-10-02 | Disposition: A | Discharge status: Home / Self Care | Payer: BC Managed Care – PPO | Source: Other Acute Inpatient Hospital | Attending: Infectious Diseases | Admitting: Infectious Diseases

## 2018-10-02 DIAGNOSIS — A4151 Sepsis due to Escherichia coli [E. coli]: Secondary | ICD-10-CM | POA: Insufficient documentation

## 2018-10-02 LAB — CBC WITH DIFFERENTIAL/PLATELET
Abs Immature Granulocytes: 0.46 10*3/uL — ABNORMAL HIGH (ref 0.00–0.07)
Basophils Absolute: 0.1 10*3/uL (ref 0.0–0.1)
Basophils Relative: 1 %
Eosinophils Absolute: 0.2 10*3/uL (ref 0.0–0.5)
Eosinophils Relative: 2 %
HCT: 28.8 % — ABNORMAL LOW (ref 36.0–46.0)
Hemoglobin: 8.9 g/dL — ABNORMAL LOW (ref 12.0–15.0)
Immature Granulocytes: 4 %
Lymphocytes Relative: 23 %
Lymphs Abs: 2.5 10*3/uL (ref 0.7–4.0)
MCH: 29.4 pg (ref 26.0–34.0)
MCHC: 30.9 g/dL (ref 30.0–36.0)
MCV: 95 fL (ref 80.0–100.0)
Monocytes Absolute: 0.8 10*3/uL (ref 0.1–1.0)
Monocytes Relative: 7 %
Neutro Abs: 6.9 10*3/uL (ref 1.7–7.7)
Neutrophils Relative %: 63 %
Platelets: 422 10*3/uL — ABNORMAL HIGH (ref 150–400)
RBC: 3.03 MIL/uL — ABNORMAL LOW (ref 3.87–5.11)
RDW: 13.2 % (ref 11.5–15.5)
WBC: 10.9 10*3/uL — ABNORMAL HIGH (ref 4.0–10.5)
nRBC: 0 % (ref 0.0–0.2)

## 2018-10-02 LAB — COMPREHENSIVE METABOLIC PANEL
ALT: 15 U/L (ref 0–44)
AST: 16 U/L (ref 15–41)
Albumin: 2.7 g/dL — ABNORMAL LOW (ref 3.5–5.0)
Alkaline Phosphatase: 101 U/L (ref 38–126)
Anion gap: 10 (ref 5–15)
BUN: 22 mg/dL — ABNORMAL HIGH (ref 6–20)
CO2: 23 mmol/L (ref 22–32)
Calcium: 8.6 mg/dL — ABNORMAL LOW (ref 8.9–10.3)
Chloride: 108 mmol/L (ref 98–111)
Creatinine, Ser: 0.65 mg/dL (ref 0.44–1.00)
GFR calc Af Amer: >60 mL/min (ref >60)
GFR calc non Af Amer: >60 mL/min (ref >60)
Glucose, Bld: 102 mg/dL — ABNORMAL HIGH (ref 70–99)
Potassium: 3.9 mmol/L (ref 3.5–5.1)
Sodium: 141 mmol/L (ref 135–145)
Total Bilirubin: 0.5 mg/dL (ref 0.3–1.2)
Total Protein: 6.2 g/dL — ABNORMAL LOW (ref 6.5–8.1)

## 2018-10-25 ENCOUNTER — Other Ambulatory Visit: Payer: Self-pay | Admitting: Obstetrics and Gynecology

## 2018-10-25 DIAGNOSIS — Z1231 Encounter for screening mammogram for malignant neoplasm of breast: Secondary | ICD-10-CM

## 2018-11-06 ENCOUNTER — Ambulatory Visit
Admission: RE | Admit: 2018-11-06 | Discharge: 2018-11-06 | Disposition: A | Discharge status: Home / Self Care | Payer: BC Managed Care – PPO | Source: Ambulatory Visit | Attending: Obstetrics and Gynecology | Admitting: Obstetrics and Gynecology

## 2018-11-06 DIAGNOSIS — Z1231 Encounter for screening mammogram for malignant neoplasm of breast: Secondary | ICD-10-CM | POA: Diagnosis not present

## 2019-03-13 NOTE — Telephone Encounter (Signed)
Taken care of

## 2021-03-17 ENCOUNTER — Other Ambulatory Visit: Payer: Self-pay | Admitting: Obstetrics and Gynecology

## 2021-03-17 DIAGNOSIS — Z139 Encounter for screening, unspecified: Secondary | ICD-10-CM

## 2021-03-18 ENCOUNTER — Ambulatory Visit
Admission: RE | Admit: 2021-03-18 | Discharge: 2021-03-18 | Disposition: A | Discharge status: Home / Self Care | Payer: BC Managed Care – PPO | Source: Ambulatory Visit | Attending: Obstetrics and Gynecology | Admitting: Obstetrics and Gynecology

## 2021-03-18 ENCOUNTER — Other Ambulatory Visit: Payer: Self-pay

## 2021-03-18 DIAGNOSIS — Z139 Encounter for screening, unspecified: Secondary | ICD-10-CM

## 2021-06-03 IMAGING — US US RENAL
1 series · 14 of 25 positions shown · non-contrast
Comparison: None.

CLINICAL DATA: Right flank pain.  Third trimester gestation

EXAM:
RENAL / URINARY TRACT ULTRASOUND COMPLETE

[Series 1: us renal · 14 of 31 slices shown]
[im 1/31]
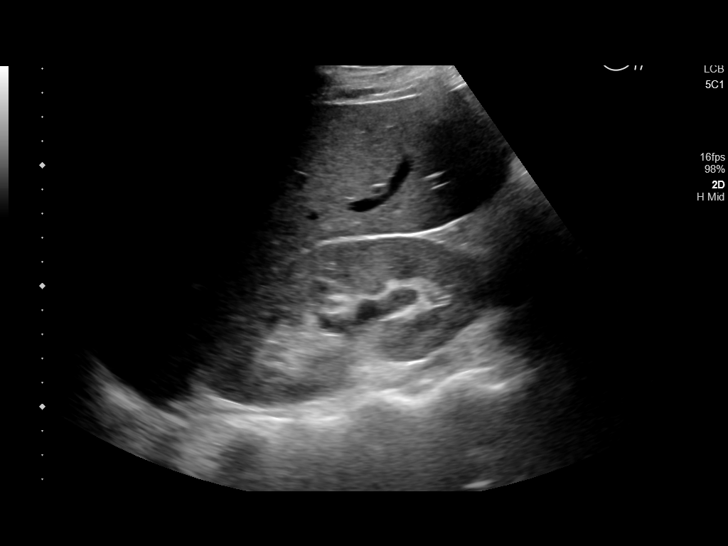
[im 3/31]
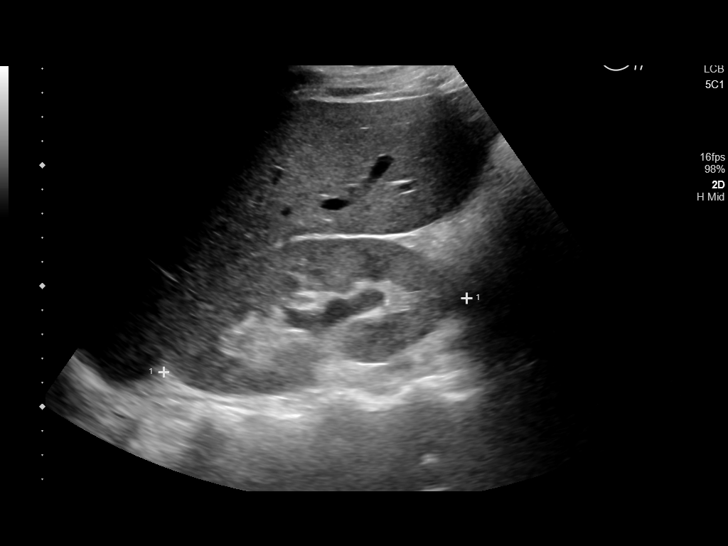
[im 6/31]
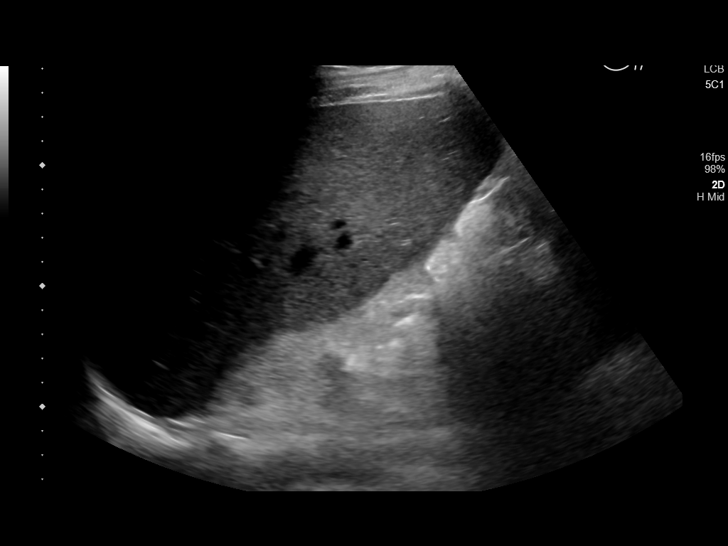
[im 8/31]
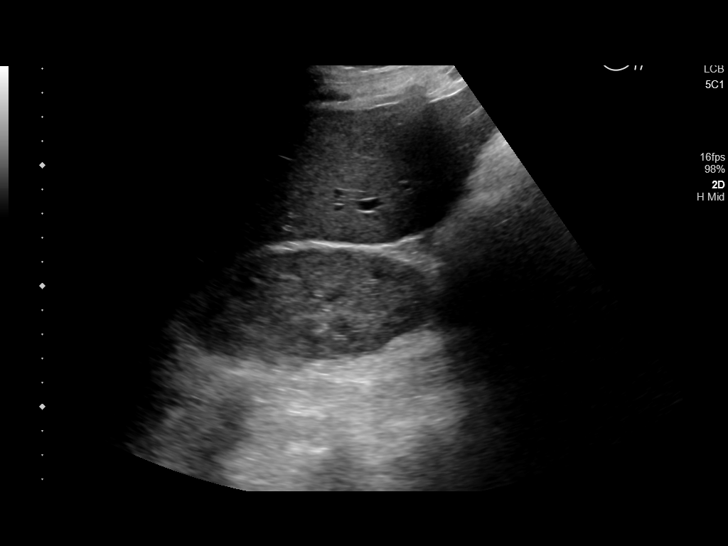
[im 11/31]
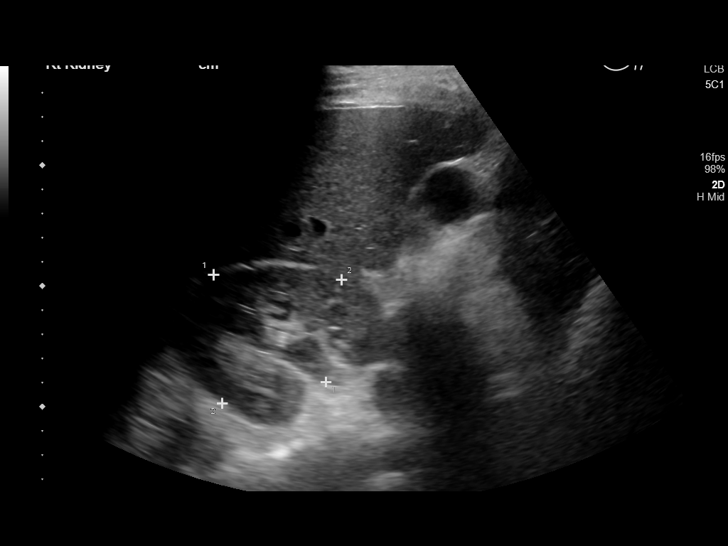
[im 12/31]
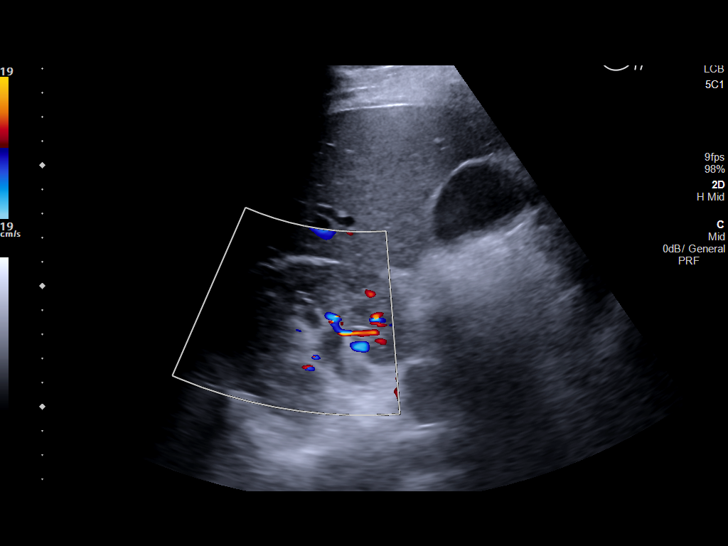
[im 14/31]
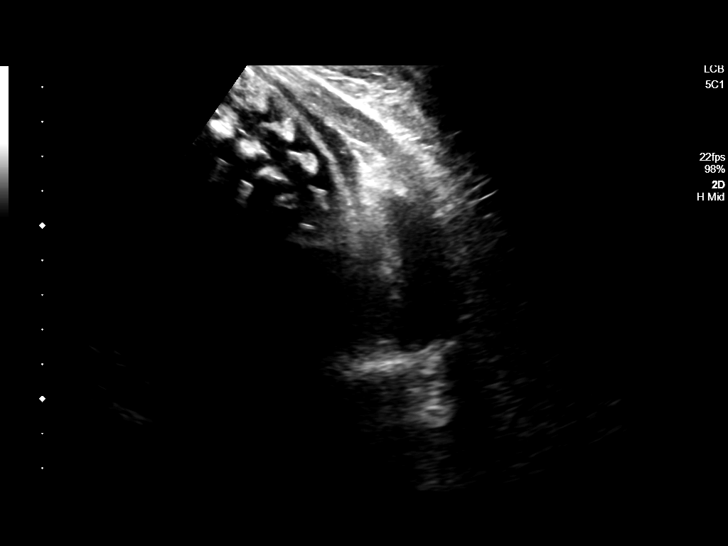
[im 17/31]
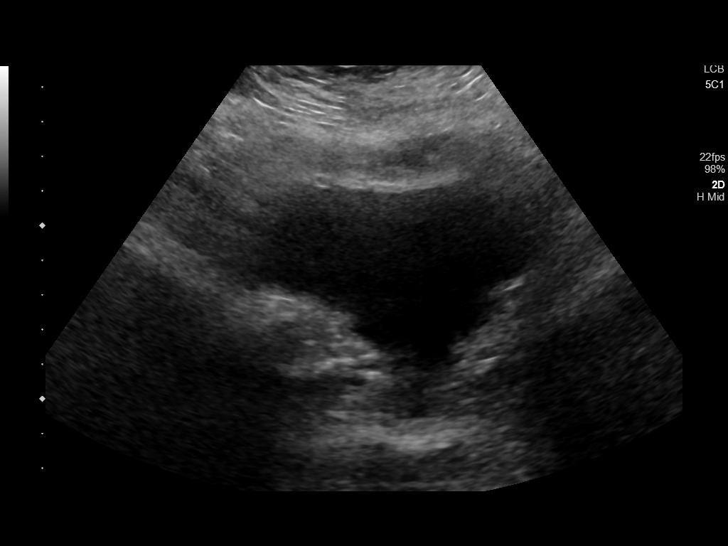
[im 19/31]
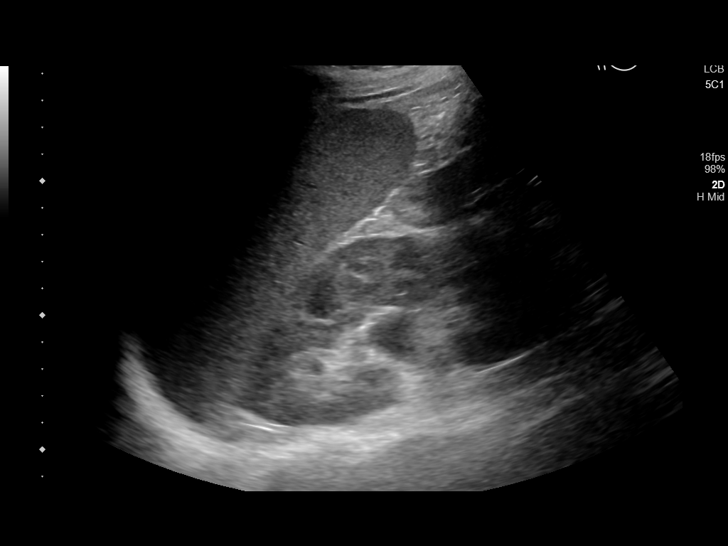
[im 21/31]
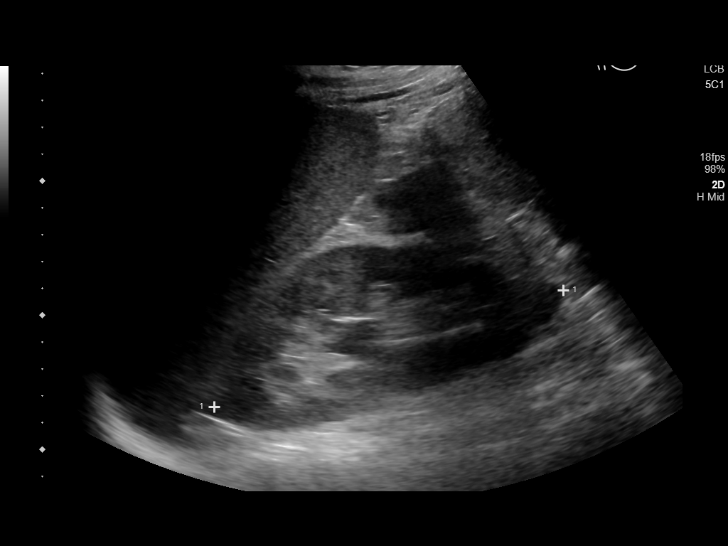
[im 23/31]
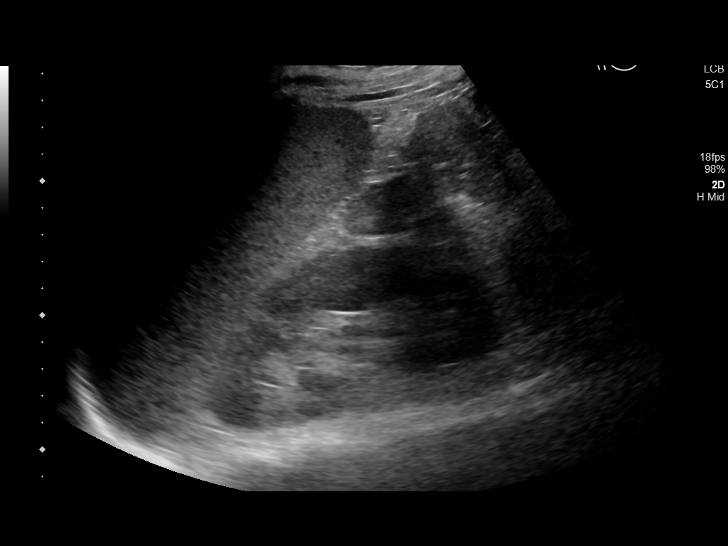
[im 26/31]
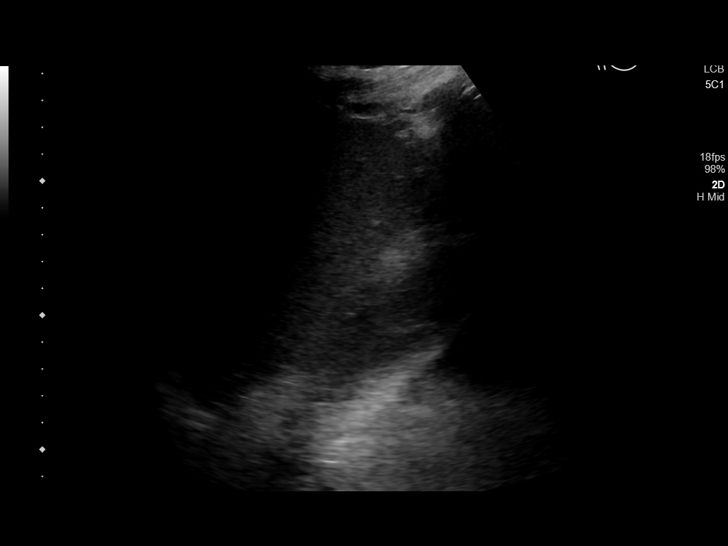
[im 28/31]
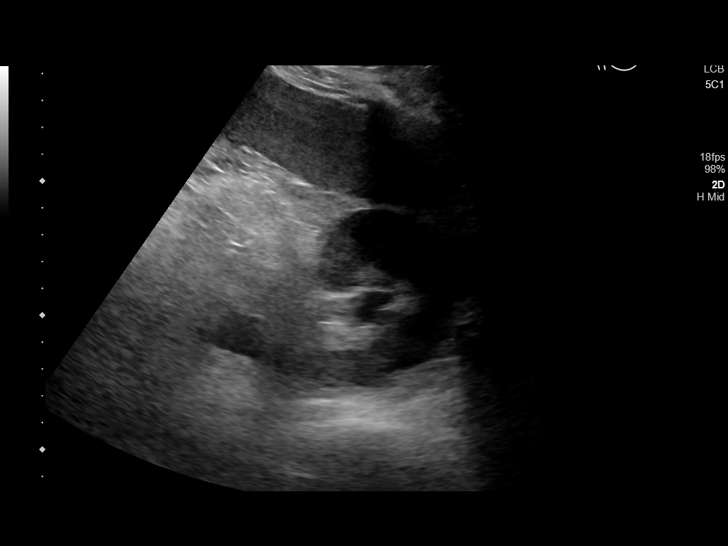
[im 31/31]
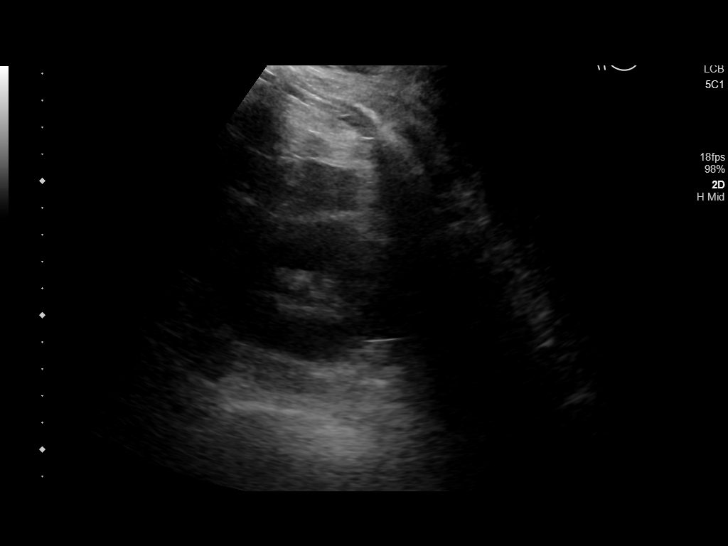

[14 of 25 positions shown; findings below may reference images not displayed]

FINDINGS: Right Kidney:

Renal measurements: 12.9 x 6.4 x 7.1 cm = volume: 310 mL .
Echogenicity and renal cortical thickness are within normal limits.
No mass or perinephric fluid visualized. There is mild pelviectasis
without calyceal dilatation. No sonographically demonstrable
calculus or ureterectasis evident.

Left Kidney:

Renal measurements: 13.7 x 5.6 x 6.1 cm = volume: 247 mL.
Echogenicity and renal cortical thickness within normal limits. No
mass or perinephric fluid visualized. There is slight fullness of
the left renal pelvis without caliectasis.

Bladder:

Appears normal for degree of bladder distention.
IMPRESSION: Mild fullness of each renal pelvis, slightly more on the right than
on the left. No ureteral or caliceal dilatation evident. No
obstructing focus evident on either side. Study otherwise
unremarkable.

## 2021-06-04 IMAGING — DX PORTABLE CHEST - 1 VIEW
1 series · 1 of 1 positions shown · non-contrast
Comparison: None.

CLINICAL DATA: Cough with chills.

EXAM:
PORTABLE CHEST 1 VIEW

[chest ap]
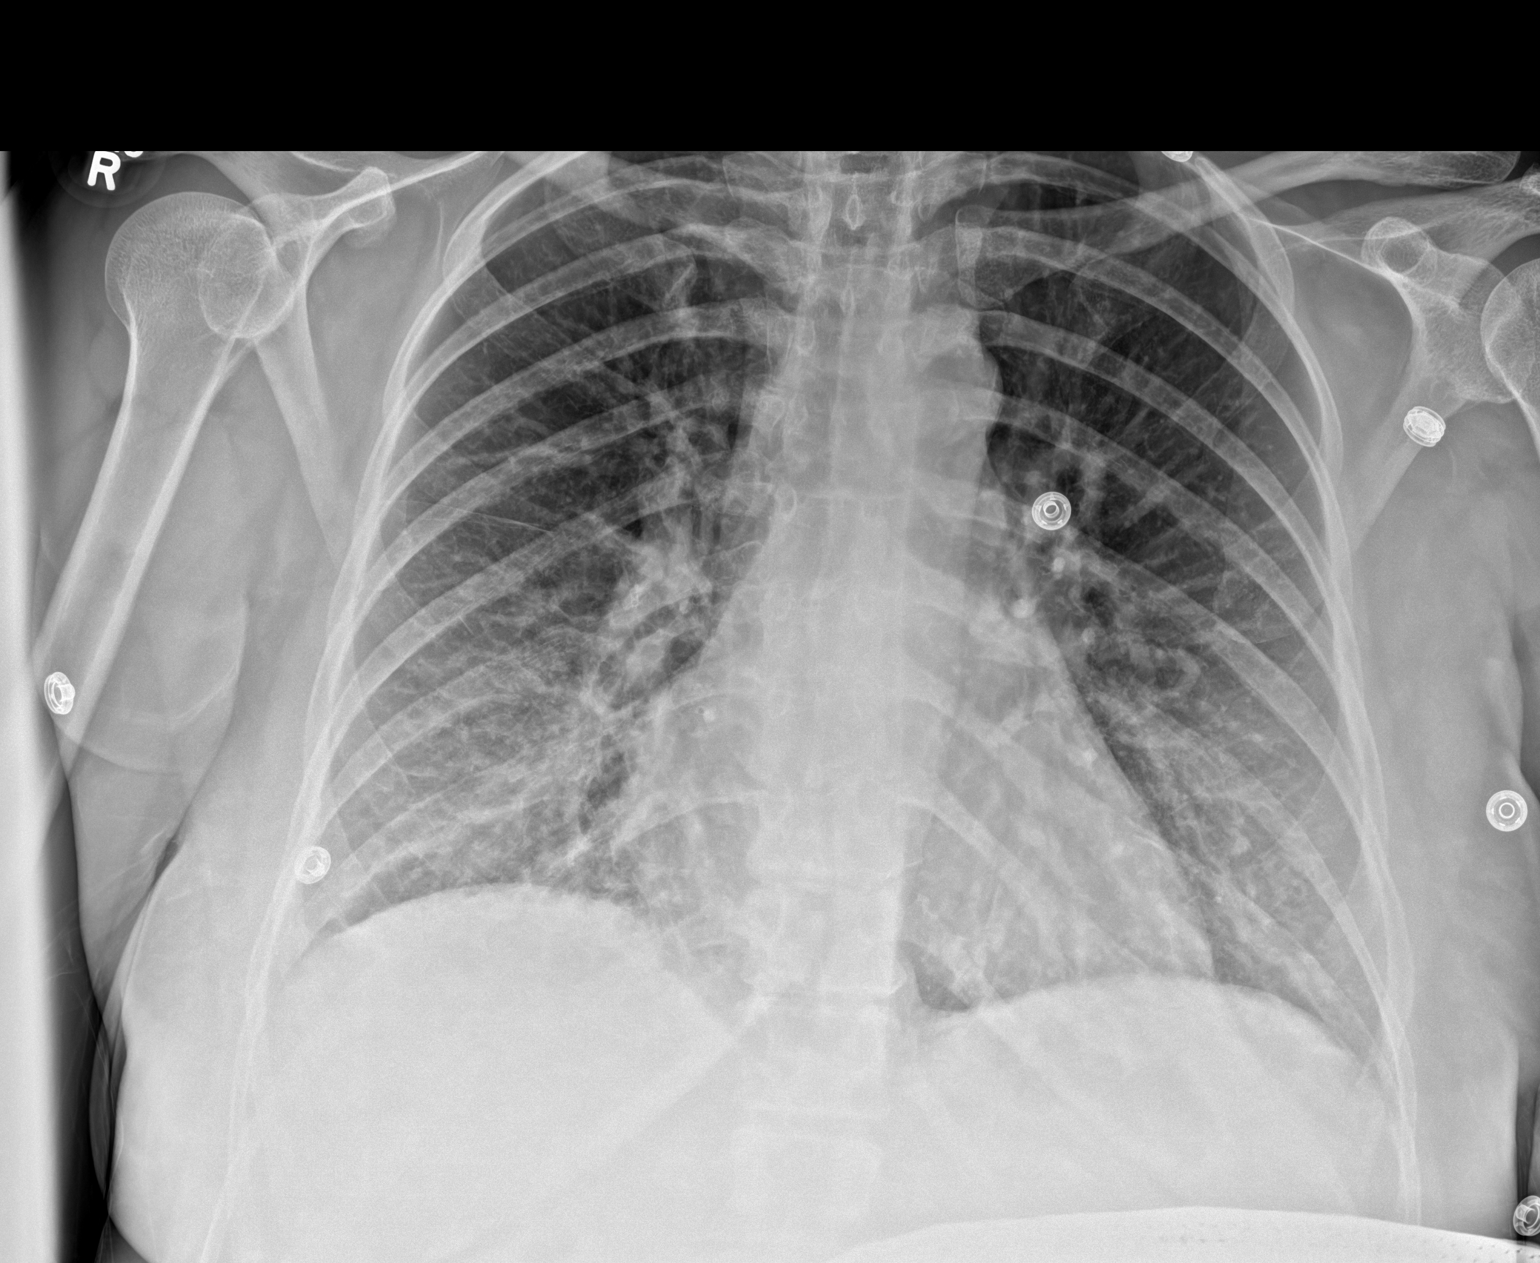

[1 of 1 positions shown; findings below may reference images not displayed]

FINDINGS: Cardiac silhouette is normal in size and configuration. No
mediastinal or hilar masses. There is no evidence of adenopathy.

There are prominent bronchovascular markings and mild interstitial
thickening most evident in the lower lungs. Lungs are otherwise
clear. No pleural effusion or pneumothorax.

Skeletal structures are grossly intact.
IMPRESSION: 1. Prominent bronchovascular and interstitial markings in the lower
lungs. Possible acute interstitial infection/inflammation.

## 2021-12-04 DIAGNOSIS — F4323 Adjustment disorder with mixed anxiety and depressed mood: Secondary | ICD-10-CM | POA: Diagnosis not present

## 2021-12-04 DIAGNOSIS — F411 Generalized anxiety disorder: Secondary | ICD-10-CM | POA: Diagnosis not present

## 2021-12-10 DIAGNOSIS — F411 Generalized anxiety disorder: Secondary | ICD-10-CM | POA: Diagnosis not present

## 2021-12-10 DIAGNOSIS — F4323 Adjustment disorder with mixed anxiety and depressed mood: Secondary | ICD-10-CM | POA: Diagnosis not present

## 2021-12-17 DIAGNOSIS — F4323 Adjustment disorder with mixed anxiety and depressed mood: Secondary | ICD-10-CM | POA: Diagnosis not present

## 2021-12-17 DIAGNOSIS — F411 Generalized anxiety disorder: Secondary | ICD-10-CM | POA: Diagnosis not present

## 2021-12-24 DIAGNOSIS — F4323 Adjustment disorder with mixed anxiety and depressed mood: Secondary | ICD-10-CM | POA: Diagnosis not present

## 2021-12-24 DIAGNOSIS — F411 Generalized anxiety disorder: Secondary | ICD-10-CM | POA: Diagnosis not present

## 2022-01-01 DIAGNOSIS — F411 Generalized anxiety disorder: Secondary | ICD-10-CM | POA: Diagnosis not present

## 2022-01-04 DIAGNOSIS — M9903 Segmental and somatic dysfunction of lumbar region: Secondary | ICD-10-CM | POA: Diagnosis not present

## 2022-01-04 DIAGNOSIS — R519 Headache, unspecified: Secondary | ICD-10-CM | POA: Diagnosis not present

## 2022-01-04 DIAGNOSIS — M9901 Segmental and somatic dysfunction of cervical region: Secondary | ICD-10-CM | POA: Diagnosis not present

## 2022-01-04 DIAGNOSIS — M542 Cervicalgia: Secondary | ICD-10-CM | POA: Diagnosis not present

## 2022-01-05 DIAGNOSIS — M9901 Segmental and somatic dysfunction of cervical region: Secondary | ICD-10-CM | POA: Diagnosis not present

## 2022-01-05 DIAGNOSIS — M9903 Segmental and somatic dysfunction of lumbar region: Secondary | ICD-10-CM | POA: Diagnosis not present

## 2022-01-05 DIAGNOSIS — R519 Headache, unspecified: Secondary | ICD-10-CM | POA: Diagnosis not present

## 2022-01-05 DIAGNOSIS — M542 Cervicalgia: Secondary | ICD-10-CM | POA: Diagnosis not present

## 2022-01-06 DIAGNOSIS — D225 Melanocytic nevi of trunk: Secondary | ICD-10-CM | POA: Diagnosis not present

## 2022-01-06 DIAGNOSIS — D2271 Melanocytic nevi of right lower limb, including hip: Secondary | ICD-10-CM | POA: Diagnosis not present

## 2022-01-06 DIAGNOSIS — D2262 Melanocytic nevi of left upper limb, including shoulder: Secondary | ICD-10-CM | POA: Diagnosis not present

## 2022-01-06 DIAGNOSIS — D2261 Melanocytic nevi of right upper limb, including shoulder: Secondary | ICD-10-CM | POA: Diagnosis not present

## 2022-01-07 DIAGNOSIS — F411 Generalized anxiety disorder: Secondary | ICD-10-CM | POA: Diagnosis not present

## 2022-01-08 DIAGNOSIS — M542 Cervicalgia: Secondary | ICD-10-CM | POA: Diagnosis not present

## 2022-01-08 DIAGNOSIS — M9901 Segmental and somatic dysfunction of cervical region: Secondary | ICD-10-CM | POA: Diagnosis not present

## 2022-01-08 DIAGNOSIS — R519 Headache, unspecified: Secondary | ICD-10-CM | POA: Diagnosis not present

## 2022-01-08 DIAGNOSIS — M9903 Segmental and somatic dysfunction of lumbar region: Secondary | ICD-10-CM | POA: Diagnosis not present

## 2022-01-11 DIAGNOSIS — M9901 Segmental and somatic dysfunction of cervical region: Secondary | ICD-10-CM | POA: Diagnosis not present

## 2022-01-11 DIAGNOSIS — M542 Cervicalgia: Secondary | ICD-10-CM | POA: Diagnosis not present

## 2022-01-11 DIAGNOSIS — M9903 Segmental and somatic dysfunction of lumbar region: Secondary | ICD-10-CM | POA: Diagnosis not present

## 2022-01-11 DIAGNOSIS — R519 Headache, unspecified: Secondary | ICD-10-CM | POA: Diagnosis not present

## 2022-01-19 DIAGNOSIS — M9903 Segmental and somatic dysfunction of lumbar region: Secondary | ICD-10-CM | POA: Diagnosis not present

## 2022-01-19 DIAGNOSIS — M9901 Segmental and somatic dysfunction of cervical region: Secondary | ICD-10-CM | POA: Diagnosis not present

## 2022-01-19 DIAGNOSIS — M542 Cervicalgia: Secondary | ICD-10-CM | POA: Diagnosis not present

## 2022-01-19 DIAGNOSIS — R519 Headache, unspecified: Secondary | ICD-10-CM | POA: Diagnosis not present

## 2022-01-21 DIAGNOSIS — R519 Headache, unspecified: Secondary | ICD-10-CM | POA: Diagnosis not present

## 2022-01-21 DIAGNOSIS — M9903 Segmental and somatic dysfunction of lumbar region: Secondary | ICD-10-CM | POA: Diagnosis not present

## 2022-01-21 DIAGNOSIS — M542 Cervicalgia: Secondary | ICD-10-CM | POA: Diagnosis not present

## 2022-01-21 DIAGNOSIS — M9901 Segmental and somatic dysfunction of cervical region: Secondary | ICD-10-CM | POA: Diagnosis not present

## 2022-01-26 DIAGNOSIS — M9901 Segmental and somatic dysfunction of cervical region: Secondary | ICD-10-CM | POA: Diagnosis not present

## 2022-01-26 DIAGNOSIS — M542 Cervicalgia: Secondary | ICD-10-CM | POA: Diagnosis not present

## 2022-01-26 DIAGNOSIS — F411 Generalized anxiety disorder: Secondary | ICD-10-CM | POA: Diagnosis not present

## 2022-01-26 DIAGNOSIS — M9903 Segmental and somatic dysfunction of lumbar region: Secondary | ICD-10-CM | POA: Diagnosis not present

## 2022-01-26 DIAGNOSIS — R519 Headache, unspecified: Secondary | ICD-10-CM | POA: Diagnosis not present

## 2022-01-28 DIAGNOSIS — M9901 Segmental and somatic dysfunction of cervical region: Secondary | ICD-10-CM | POA: Diagnosis not present

## 2022-01-28 DIAGNOSIS — R519 Headache, unspecified: Secondary | ICD-10-CM | POA: Diagnosis not present

## 2022-01-28 DIAGNOSIS — M542 Cervicalgia: Secondary | ICD-10-CM | POA: Diagnosis not present

## 2022-01-28 DIAGNOSIS — M9903 Segmental and somatic dysfunction of lumbar region: Secondary | ICD-10-CM | POA: Diagnosis not present

## 2022-02-01 DIAGNOSIS — M542 Cervicalgia: Secondary | ICD-10-CM | POA: Diagnosis not present

## 2022-02-01 DIAGNOSIS — R519 Headache, unspecified: Secondary | ICD-10-CM | POA: Diagnosis not present

## 2022-02-01 DIAGNOSIS — M9903 Segmental and somatic dysfunction of lumbar region: Secondary | ICD-10-CM | POA: Diagnosis not present

## 2022-02-01 DIAGNOSIS — M9901 Segmental and somatic dysfunction of cervical region: Secondary | ICD-10-CM | POA: Diagnosis not present

## 2022-02-09 DIAGNOSIS — F411 Generalized anxiety disorder: Secondary | ICD-10-CM | POA: Diagnosis not present

## 2022-02-12 DIAGNOSIS — R519 Headache, unspecified: Secondary | ICD-10-CM | POA: Diagnosis not present

## 2022-02-12 DIAGNOSIS — M9903 Segmental and somatic dysfunction of lumbar region: Secondary | ICD-10-CM | POA: Diagnosis not present

## 2022-02-12 DIAGNOSIS — M542 Cervicalgia: Secondary | ICD-10-CM | POA: Diagnosis not present

## 2022-02-12 DIAGNOSIS — M9901 Segmental and somatic dysfunction of cervical region: Secondary | ICD-10-CM | POA: Diagnosis not present

## 2022-02-18 DIAGNOSIS — M9901 Segmental and somatic dysfunction of cervical region: Secondary | ICD-10-CM | POA: Diagnosis not present

## 2022-02-18 DIAGNOSIS — M9903 Segmental and somatic dysfunction of lumbar region: Secondary | ICD-10-CM | POA: Diagnosis not present

## 2022-02-18 DIAGNOSIS — M542 Cervicalgia: Secondary | ICD-10-CM | POA: Diagnosis not present

## 2022-02-18 DIAGNOSIS — R519 Headache, unspecified: Secondary | ICD-10-CM | POA: Diagnosis not present

## 2022-02-26 DIAGNOSIS — F411 Generalized anxiety disorder: Secondary | ICD-10-CM | POA: Diagnosis not present

## 2022-03-09 DIAGNOSIS — F411 Generalized anxiety disorder: Secondary | ICD-10-CM | POA: Diagnosis not present

## 2022-03-18 ENCOUNTER — Other Ambulatory Visit: Payer: Self-pay | Admitting: Obstetrics and Gynecology

## 2022-03-18 DIAGNOSIS — Z1231 Encounter for screening mammogram for malignant neoplasm of breast: Secondary | ICD-10-CM

## 2022-03-24 ENCOUNTER — Ambulatory Visit
Admission: RE | Admit: 2022-03-24 | Discharge: 2022-03-24 | Disposition: A | Discharge status: Home / Self Care | Payer: BC Managed Care – PPO | Source: Ambulatory Visit | Attending: Obstetrics and Gynecology | Admitting: Obstetrics and Gynecology

## 2022-03-24 DIAGNOSIS — Z1231 Encounter for screening mammogram for malignant neoplasm of breast: Secondary | ICD-10-CM

## 2022-04-01 DIAGNOSIS — R519 Headache, unspecified: Secondary | ICD-10-CM | POA: Diagnosis not present

## 2022-04-01 DIAGNOSIS — M542 Cervicalgia: Secondary | ICD-10-CM | POA: Diagnosis not present

## 2022-04-01 DIAGNOSIS — M9901 Segmental and somatic dysfunction of cervical region: Secondary | ICD-10-CM | POA: Diagnosis not present

## 2022-04-01 DIAGNOSIS — M9903 Segmental and somatic dysfunction of lumbar region: Secondary | ICD-10-CM | POA: Diagnosis not present

## 2022-04-08 DIAGNOSIS — F411 Generalized anxiety disorder: Secondary | ICD-10-CM | POA: Diagnosis not present

## 2022-04-29 DIAGNOSIS — M542 Cervicalgia: Secondary | ICD-10-CM | POA: Diagnosis not present

## 2022-04-29 DIAGNOSIS — M9901 Segmental and somatic dysfunction of cervical region: Secondary | ICD-10-CM | POA: Diagnosis not present

## 2022-04-29 DIAGNOSIS — R519 Headache, unspecified: Secondary | ICD-10-CM | POA: Diagnosis not present

## 2022-04-29 DIAGNOSIS — M9903 Segmental and somatic dysfunction of lumbar region: Secondary | ICD-10-CM | POA: Diagnosis not present

## 2022-05-11 DIAGNOSIS — F411 Generalized anxiety disorder: Secondary | ICD-10-CM | POA: Diagnosis not present

## 2022-05-18 DIAGNOSIS — F411 Generalized anxiety disorder: Secondary | ICD-10-CM | POA: Diagnosis not present

## 2022-05-27 DIAGNOSIS — M9901 Segmental and somatic dysfunction of cervical region: Secondary | ICD-10-CM | POA: Diagnosis not present

## 2022-05-27 DIAGNOSIS — M9903 Segmental and somatic dysfunction of lumbar region: Secondary | ICD-10-CM | POA: Diagnosis not present

## 2022-05-27 DIAGNOSIS — R519 Headache, unspecified: Secondary | ICD-10-CM | POA: Diagnosis not present

## 2022-05-27 DIAGNOSIS — M542 Cervicalgia: Secondary | ICD-10-CM | POA: Diagnosis not present

## 2022-06-01 DIAGNOSIS — F411 Generalized anxiety disorder: Secondary | ICD-10-CM | POA: Diagnosis not present

## 2022-06-15 DIAGNOSIS — F411 Generalized anxiety disorder: Secondary | ICD-10-CM | POA: Diagnosis not present

## 2022-06-24 DIAGNOSIS — M9903 Segmental and somatic dysfunction of lumbar region: Secondary | ICD-10-CM | POA: Diagnosis not present

## 2022-06-24 DIAGNOSIS — R519 Headache, unspecified: Secondary | ICD-10-CM | POA: Diagnosis not present

## 2022-06-24 DIAGNOSIS — M9901 Segmental and somatic dysfunction of cervical region: Secondary | ICD-10-CM | POA: Diagnosis not present

## 2022-06-24 DIAGNOSIS — M542 Cervicalgia: Secondary | ICD-10-CM | POA: Diagnosis not present

## 2022-06-25 DIAGNOSIS — F411 Generalized anxiety disorder: Secondary | ICD-10-CM | POA: Diagnosis not present

## 2022-07-02 DIAGNOSIS — F411 Generalized anxiety disorder: Secondary | ICD-10-CM | POA: Diagnosis not present

## 2022-07-08 DIAGNOSIS — F411 Generalized anxiety disorder: Secondary | ICD-10-CM | POA: Diagnosis not present

## 2022-07-13 DIAGNOSIS — F4312 Post-traumatic stress disorder, chronic: Secondary | ICD-10-CM | POA: Diagnosis not present

## 2022-07-13 DIAGNOSIS — F411 Generalized anxiety disorder: Secondary | ICD-10-CM | POA: Diagnosis not present

## 2022-07-23 DIAGNOSIS — F4312 Post-traumatic stress disorder, chronic: Secondary | ICD-10-CM | POA: Diagnosis not present

## 2022-07-23 DIAGNOSIS — F411 Generalized anxiety disorder: Secondary | ICD-10-CM | POA: Diagnosis not present

## 2022-07-29 DIAGNOSIS — M9901 Segmental and somatic dysfunction of cervical region: Secondary | ICD-10-CM | POA: Diagnosis not present

## 2022-07-29 DIAGNOSIS — M542 Cervicalgia: Secondary | ICD-10-CM | POA: Diagnosis not present

## 2022-07-29 DIAGNOSIS — R519 Headache, unspecified: Secondary | ICD-10-CM | POA: Diagnosis not present

## 2022-07-29 DIAGNOSIS — M9903 Segmental and somatic dysfunction of lumbar region: Secondary | ICD-10-CM | POA: Diagnosis not present

## 2022-07-30 DIAGNOSIS — F411 Generalized anxiety disorder: Secondary | ICD-10-CM | POA: Diagnosis not present

## 2022-07-30 DIAGNOSIS — F4312 Post-traumatic stress disorder, chronic: Secondary | ICD-10-CM | POA: Diagnosis not present

## 2022-08-06 DIAGNOSIS — F4312 Post-traumatic stress disorder, chronic: Secondary | ICD-10-CM | POA: Diagnosis not present

## 2022-08-06 DIAGNOSIS — F411 Generalized anxiety disorder: Secondary | ICD-10-CM | POA: Diagnosis not present

## 2022-08-11 DIAGNOSIS — Z1331 Encounter for screening for depression: Secondary | ICD-10-CM | POA: Diagnosis not present

## 2022-08-11 DIAGNOSIS — Z01419 Encounter for gynecological examination (general) (routine) without abnormal findings: Secondary | ICD-10-CM | POA: Diagnosis not present

## 2022-08-17 DIAGNOSIS — F4312 Post-traumatic stress disorder, chronic: Secondary | ICD-10-CM | POA: Diagnosis not present

## 2022-08-17 DIAGNOSIS — F411 Generalized anxiety disorder: Secondary | ICD-10-CM | POA: Diagnosis not present

## 2022-08-31 DIAGNOSIS — F411 Generalized anxiety disorder: Secondary | ICD-10-CM | POA: Diagnosis not present

## 2022-08-31 DIAGNOSIS — F4312 Post-traumatic stress disorder, chronic: Secondary | ICD-10-CM | POA: Diagnosis not present

## 2022-09-02 DIAGNOSIS — R519 Headache, unspecified: Secondary | ICD-10-CM | POA: Diagnosis not present

## 2022-09-02 DIAGNOSIS — M9901 Segmental and somatic dysfunction of cervical region: Secondary | ICD-10-CM | POA: Diagnosis not present

## 2022-09-02 DIAGNOSIS — M542 Cervicalgia: Secondary | ICD-10-CM | POA: Diagnosis not present

## 2022-09-02 DIAGNOSIS — M9903 Segmental and somatic dysfunction of lumbar region: Secondary | ICD-10-CM | POA: Diagnosis not present

## 2022-09-07 DIAGNOSIS — F4312 Post-traumatic stress disorder, chronic: Secondary | ICD-10-CM | POA: Diagnosis not present

## 2022-09-07 DIAGNOSIS — F411 Generalized anxiety disorder: Secondary | ICD-10-CM | POA: Diagnosis not present

## 2022-09-21 DIAGNOSIS — F4312 Post-traumatic stress disorder, chronic: Secondary | ICD-10-CM | POA: Diagnosis not present

## 2022-09-21 DIAGNOSIS — F411 Generalized anxiety disorder: Secondary | ICD-10-CM | POA: Diagnosis not present

## 2022-10-05 DIAGNOSIS — F4312 Post-traumatic stress disorder, chronic: Secondary | ICD-10-CM | POA: Diagnosis not present

## 2022-10-05 DIAGNOSIS — F411 Generalized anxiety disorder: Secondary | ICD-10-CM | POA: Diagnosis not present

## 2022-10-19 DIAGNOSIS — F411 Generalized anxiety disorder: Secondary | ICD-10-CM | POA: Diagnosis not present

## 2022-10-19 DIAGNOSIS — F4312 Post-traumatic stress disorder, chronic: Secondary | ICD-10-CM | POA: Diagnosis not present

## 2022-10-21 DIAGNOSIS — R519 Headache, unspecified: Secondary | ICD-10-CM | POA: Diagnosis not present

## 2022-10-21 DIAGNOSIS — M9901 Segmental and somatic dysfunction of cervical region: Secondary | ICD-10-CM | POA: Diagnosis not present

## 2022-10-21 DIAGNOSIS — M9903 Segmental and somatic dysfunction of lumbar region: Secondary | ICD-10-CM | POA: Diagnosis not present

## 2022-10-21 DIAGNOSIS — M542 Cervicalgia: Secondary | ICD-10-CM | POA: Diagnosis not present

## 2022-11-05 DIAGNOSIS — F411 Generalized anxiety disorder: Secondary | ICD-10-CM | POA: Diagnosis not present

## 2022-11-05 DIAGNOSIS — F4312 Post-traumatic stress disorder, chronic: Secondary | ICD-10-CM | POA: Diagnosis not present

## 2022-11-18 DIAGNOSIS — M542 Cervicalgia: Secondary | ICD-10-CM | POA: Diagnosis not present

## 2022-11-18 DIAGNOSIS — R519 Headache, unspecified: Secondary | ICD-10-CM | POA: Diagnosis not present

## 2022-11-18 DIAGNOSIS — M9901 Segmental and somatic dysfunction of cervical region: Secondary | ICD-10-CM | POA: Diagnosis not present

## 2022-11-18 DIAGNOSIS — M9903 Segmental and somatic dysfunction of lumbar region: Secondary | ICD-10-CM | POA: Diagnosis not present

## 2022-12-03 DIAGNOSIS — F4312 Post-traumatic stress disorder, chronic: Secondary | ICD-10-CM | POA: Diagnosis not present

## 2022-12-03 DIAGNOSIS — F411 Generalized anxiety disorder: Secondary | ICD-10-CM | POA: Diagnosis not present

## 2022-12-30 DIAGNOSIS — M9903 Segmental and somatic dysfunction of lumbar region: Secondary | ICD-10-CM | POA: Diagnosis not present

## 2022-12-30 DIAGNOSIS — R519 Headache, unspecified: Secondary | ICD-10-CM | POA: Diagnosis not present

## 2022-12-30 DIAGNOSIS — M9901 Segmental and somatic dysfunction of cervical region: Secondary | ICD-10-CM | POA: Diagnosis not present

## 2022-12-30 DIAGNOSIS — M542 Cervicalgia: Secondary | ICD-10-CM | POA: Diagnosis not present

## 2023-01-03 DIAGNOSIS — F411 Generalized anxiety disorder: Secondary | ICD-10-CM | POA: Diagnosis not present

## 2023-01-03 DIAGNOSIS — F4312 Post-traumatic stress disorder, chronic: Secondary | ICD-10-CM | POA: Diagnosis not present

## 2023-01-12 DIAGNOSIS — D2272 Melanocytic nevi of left lower limb, including hip: Secondary | ICD-10-CM | POA: Diagnosis not present

## 2023-01-12 DIAGNOSIS — D2262 Melanocytic nevi of left upper limb, including shoulder: Secondary | ICD-10-CM | POA: Diagnosis not present

## 2023-01-12 DIAGNOSIS — D2271 Melanocytic nevi of right lower limb, including hip: Secondary | ICD-10-CM | POA: Diagnosis not present

## 2023-01-12 DIAGNOSIS — D2261 Melanocytic nevi of right upper limb, including shoulder: Secondary | ICD-10-CM | POA: Diagnosis not present

## 2023-01-26 DIAGNOSIS — F4312 Post-traumatic stress disorder, chronic: Secondary | ICD-10-CM | POA: Diagnosis not present

## 2023-01-26 DIAGNOSIS — F411 Generalized anxiety disorder: Secondary | ICD-10-CM | POA: Diagnosis not present

## 2023-02-07 DIAGNOSIS — M9903 Segmental and somatic dysfunction of lumbar region: Secondary | ICD-10-CM | POA: Diagnosis not present

## 2023-02-07 DIAGNOSIS — R519 Headache, unspecified: Secondary | ICD-10-CM | POA: Diagnosis not present

## 2023-02-07 DIAGNOSIS — M9901 Segmental and somatic dysfunction of cervical region: Secondary | ICD-10-CM | POA: Diagnosis not present

## 2023-02-07 DIAGNOSIS — M542 Cervicalgia: Secondary | ICD-10-CM | POA: Diagnosis not present

## 2023-02-10 DIAGNOSIS — F4312 Post-traumatic stress disorder, chronic: Secondary | ICD-10-CM | POA: Diagnosis not present

## 2023-02-10 DIAGNOSIS — F411 Generalized anxiety disorder: Secondary | ICD-10-CM | POA: Diagnosis not present

## 2023-03-09 DIAGNOSIS — R519 Headache, unspecified: Secondary | ICD-10-CM | POA: Diagnosis not present

## 2023-03-09 DIAGNOSIS — M9903 Segmental and somatic dysfunction of lumbar region: Secondary | ICD-10-CM | POA: Diagnosis not present

## 2023-03-09 DIAGNOSIS — M542 Cervicalgia: Secondary | ICD-10-CM | POA: Diagnosis not present

## 2023-03-09 DIAGNOSIS — M9901 Segmental and somatic dysfunction of cervical region: Secondary | ICD-10-CM | POA: Diagnosis not present

## 2023-04-08 DIAGNOSIS — F4312 Post-traumatic stress disorder, chronic: Secondary | ICD-10-CM | POA: Diagnosis not present

## 2023-04-08 DIAGNOSIS — F411 Generalized anxiety disorder: Secondary | ICD-10-CM | POA: Diagnosis not present

## 2023-04-15 ENCOUNTER — Other Ambulatory Visit: Payer: Self-pay | Admitting: Obstetrics and Gynecology

## 2023-04-15 DIAGNOSIS — Z1231 Encounter for screening mammogram for malignant neoplasm of breast: Secondary | ICD-10-CM

## 2023-04-25 ENCOUNTER — Ambulatory Visit
Admission: RE | Admit: 2023-04-25 | Discharge: 2023-04-25 | Disposition: A | Discharge status: Home / Self Care | Payer: BC Managed Care – PPO | Source: Ambulatory Visit | Attending: Obstetrics and Gynecology | Admitting: Obstetrics and Gynecology

## 2023-04-25 DIAGNOSIS — Z1231 Encounter for screening mammogram for malignant neoplasm of breast: Secondary | ICD-10-CM

## 2023-05-04 DIAGNOSIS — R519 Headache, unspecified: Secondary | ICD-10-CM | POA: Diagnosis not present

## 2023-05-04 DIAGNOSIS — M542 Cervicalgia: Secondary | ICD-10-CM | POA: Diagnosis not present

## 2023-05-04 DIAGNOSIS — M9901 Segmental and somatic dysfunction of cervical region: Secondary | ICD-10-CM | POA: Diagnosis not present

## 2023-05-04 DIAGNOSIS — M9903 Segmental and somatic dysfunction of lumbar region: Secondary | ICD-10-CM | POA: Diagnosis not present

## 2023-05-13 DIAGNOSIS — F411 Generalized anxiety disorder: Secondary | ICD-10-CM | POA: Diagnosis not present

## 2023-05-13 DIAGNOSIS — F4312 Post-traumatic stress disorder, chronic: Secondary | ICD-10-CM | POA: Diagnosis not present

## 2023-06-01 DIAGNOSIS — M542 Cervicalgia: Secondary | ICD-10-CM | POA: Diagnosis not present

## 2023-06-01 DIAGNOSIS — M9901 Segmental and somatic dysfunction of cervical region: Secondary | ICD-10-CM | POA: Diagnosis not present

## 2023-06-01 DIAGNOSIS — M9903 Segmental and somatic dysfunction of lumbar region: Secondary | ICD-10-CM | POA: Diagnosis not present

## 2023-06-01 DIAGNOSIS — R519 Headache, unspecified: Secondary | ICD-10-CM | POA: Diagnosis not present

## 2023-06-14 DIAGNOSIS — Z0189 Encounter for other specified special examinations: Secondary | ICD-10-CM | POA: Diagnosis not present

## 2023-06-17 DIAGNOSIS — F411 Generalized anxiety disorder: Secondary | ICD-10-CM | POA: Diagnosis not present

## 2023-06-17 DIAGNOSIS — F4312 Post-traumatic stress disorder, chronic: Secondary | ICD-10-CM | POA: Diagnosis not present

## 2023-06-29 DIAGNOSIS — M9903 Segmental and somatic dysfunction of lumbar region: Secondary | ICD-10-CM | POA: Diagnosis not present

## 2023-06-29 DIAGNOSIS — M9901 Segmental and somatic dysfunction of cervical region: Secondary | ICD-10-CM | POA: Diagnosis not present

## 2023-06-29 DIAGNOSIS — M542 Cervicalgia: Secondary | ICD-10-CM | POA: Diagnosis not present

## 2023-06-29 DIAGNOSIS — R519 Headache, unspecified: Secondary | ICD-10-CM | POA: Diagnosis not present

## 2023-07-15 DIAGNOSIS — F4312 Post-traumatic stress disorder, chronic: Secondary | ICD-10-CM | POA: Diagnosis not present

## 2023-07-15 DIAGNOSIS — F411 Generalized anxiety disorder: Secondary | ICD-10-CM | POA: Diagnosis not present

## 2023-08-02 DIAGNOSIS — H6982 Other specified disorders of Eustachian tube, left ear: Secondary | ICD-10-CM | POA: Diagnosis not present

## 2023-08-10 DIAGNOSIS — J020 Streptococcal pharyngitis: Secondary | ICD-10-CM | POA: Diagnosis not present

## 2023-08-12 DIAGNOSIS — F411 Generalized anxiety disorder: Secondary | ICD-10-CM | POA: Diagnosis not present

## 2023-08-12 DIAGNOSIS — F4312 Post-traumatic stress disorder, chronic: Secondary | ICD-10-CM | POA: Diagnosis not present

## 2023-08-17 DIAGNOSIS — M9901 Segmental and somatic dysfunction of cervical region: Secondary | ICD-10-CM | POA: Diagnosis not present

## 2023-08-17 DIAGNOSIS — M542 Cervicalgia: Secondary | ICD-10-CM | POA: Diagnosis not present

## 2023-08-17 DIAGNOSIS — M9903 Segmental and somatic dysfunction of lumbar region: Secondary | ICD-10-CM | POA: Diagnosis not present

## 2023-08-17 DIAGNOSIS — R519 Headache, unspecified: Secondary | ICD-10-CM | POA: Diagnosis not present

## 2023-08-23 DIAGNOSIS — Z1331 Encounter for screening for depression: Secondary | ICD-10-CM | POA: Diagnosis not present

## 2023-08-23 DIAGNOSIS — Z01419 Encounter for gynecological examination (general) (routine) without abnormal findings: Secondary | ICD-10-CM | POA: Diagnosis not present

## 2023-08-23 DIAGNOSIS — R102 Pelvic and perineal pain: Secondary | ICD-10-CM | POA: Diagnosis not present

## 2023-08-23 DIAGNOSIS — N951 Menopausal and female climacteric states: Secondary | ICD-10-CM | POA: Diagnosis not present

## 2023-08-23 NOTE — Progress Notes (Signed)
 Chief Complaint:   Mariah [name] is a 43 y.o. 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  Her aunt with small cell cancer, found at cervix.  History of Present Illness Mariah [name] is a 43 year old female who presents with symptoms suggestive of perimenopause, including hot flashes and night sweats.  She experiences hot flashes and night sweats, particularly bothersome in the mornings, requiring her to lay down in front of a fan due to feeling excessively hot. Sweating occurs at night and during the day, impacting her comfort and daily activities. Various deodorants, including Lume and [place] clinical, have been ineffective. She has used rubbing alcohol and hand sanitizers to manage body odor.  Her lab work from April was performed to evaluate her symptoms, but she does not recall any significant findings. Despite this, her symptoms persist. She has an intrauterine device ([name]) in place and is considering whether her symptoms could be related to hormonal changes.  She describes her mood as 'unusually titchy' and 'grouchy,' attributing this to her current symptoms. She is managing life stressors, including parenting a four-year-old child and dealing with a recent divorce. Her ex-partner is not actively involved in parenting, adding to her stress. She reports coping as well as she can, with no significant sleep disturbances beyond her current life circumstances.   Pertinent hx:  - Hx of multidrug resistent UTIs  -Urology consult in 2020; work up w/ labs & cystoscopy all normal  - C/S 09/2018 for breech; E. 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 -Hot flashes, FSH normal - derm gave her glycopyrrolate  Patient concerns: Perimenopausal heat   Screening: Pertinent lab review: I have reviewed her last pap smear and her pregnancy hx.  Last pap:  2024 neg/neg 07/2021 neg/neg 12/2019 neg/neg 12/2018 neg/neg 10/2017 LGSIL/+HPV, colpo w/ bx benign   Last Mammogram: 02/2022 birads1 Depression screening (PHQ-9) score: Negative   Counseling:  Sexually active: not currently Concerns: No Contraception: Mariah [name] - no periods with this  Seat belt: Always Safe at home: yes Alcohol use:  reports no history of alcohol use. Smoking:  reports that she quit smoking about 11 years ago. Her smoking use included cigarettes. She has never used smokeless tobacco.  GYNHx:  -No LMP recorded. (Menstrual status: [name])..   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  Past Surgical History:  has a past surgical history that includes Cesarean section. Family History: family history includes ALS in her mother; Glaucoma in her paternal grandfather. Social History:  reports that she quit smoking about 11 years ago. Her smoking use included cigarettes. She has never used  smokeless tobacco. She reports that she does not drink alcohol and does not use drugs. Allergies: has no known allergies. Medications: Current Outpatient Medications:  .  cetirizine (ZYRTEC) 10 mg capsule, Take 10 mg by mouth once daily, Disp: , Rfl:  .  cholecalciferol 1000 unit tablet, Take by mouth, Disp: , Rfl:  .  fluticasone propionate (FLONASE) 50 mcg/actuation nasal spray, , Disp: , Rfl:  .  levonorgestrel (Mariah IU), Insert into the uterus, Disp: , Rfl:  .  montelukast  (SINGULAIR ) 10 mg tablet, TAKE 1 TABLET BY MOUTH EVERY DAY IN THE EVENING, Disp: , Rfl: 3 .  multivitamin tablet, Take 1 tablet by mouth once daily, Disp: , Rfl:  .  Bifidobacterium infantis (ALIGN) 4 mg capsule, , Disp: , Rfl:  .  cranberry fruit extract (CRANBERRY ORAL), Take by mouth (Patient not taking: Reported on 01/31/2020), Disp: , Rfl:  .  ferrous fumarate 324 mg (106 mg iron) tablet, Take 1 tablet (324 mg total) by mouth every other day (Patient not taking: Reported on 11/02/2018), Disp: 30 each, Rfl: 0 .  ibuprofen  (MOTRIN ) 800 MG tablet, , Disp: , Rfl:  .  KLOR-CON  M20 20 mEq ER tablet, TAKE 2 TABLETS (40 MEQ TOTAL) BY MOUTH 3 (THREE) TIMES DAILY. (Patient not taking: Reported on 08/05/2021), Disp: , Rfl:  .  metroNIDAZOLE (METROGEL) 0.75 % vaginal gel, Place 1 applicator vaginally nightly (Patient not taking: Reported on 01/03/2020), Disp: 70 g, Rfl: 0 .  oxyCODONE  (ROXICODONE ) 5 MG immediate release tablet, , Disp: , Rfl:  .  pyridoxine, vitamin B6, (VITAMIN B-6) 25 MG tablet, Take 1 tablet (25 mg total) by mouth 3 (three) times daily (Patient not taking: Reported on 08/24/2018), Disp: 90 tablet, Rfl: 1  Patient Active Problem List  Diagnosis  . Bacteremia due to Escherichia coli  . Low back pain  . Lumbar radiculopathy  . SIRS (systemic inflammatory response syndrome) (CMS/HHS-HCC)  . Urinary tract infection due to extended-spectrum beta lactamase (ESBL) producing Escherichia coli   Review of Systems:    ROS: see HPI for pertinent positives and negatives, otherwise a 10 system review is negative.   Specifically, she denies problems with period, abdominal pain, pelvic pain, vaginal discharge. Denies bowel problems, bladder problems, incontinence, depression or breast lumps or masses.   Exam:   BP 119/76   Pulse 89   Ht 175.3 cm (5' 9)   Wt 87 kg (191 lb 12.8 oz)   BMI 28.32 kg/m   General: Well-developed, well-nourished  female in no acute distress Lungs: CTA  CV : RRR without murmur   Breast: exam done in lying position: No dimpling or retraction, no dominant mass, no spontaneous discharge, no axillary adenopathy, dense breasts bilaterally, normal  Neck:  no thyromegaly, no lymphadenopathy Abdomen: soft, no masses, normal active bowel sounds,  non-tender, no rebound tenderness, no hernias noted Genitalia:     Pelvic exam:         External: Tanner stage 5, normal female genitalia  without lesions or masses        Bladder: Normal size without masses or tenderness, well-supported        Urethra: No lesions or discharge with palpation. Normal urethral size and location, no prolapse        Vagina: normal physiological discharge, without lesions or masses        Cervix: normal without lesions or masses, +strings present         Adnexa: normal bimanual exam without masses or fullness        Uterus: Normal size and position without masses or tenderness.         Anus/Perineum: Normal external exam  Pap collected today  Chaperone present for pelvic exam.   Impression:   The primary encounter diagnosis was Encounter for routine gynecological examination with Papanicolaou smear of cervix. Diagnoses of Depression screening (Z13.31), Pelvic pain in female, and Perimenopausal were also pertinent to this visit.  Plan:   Visit needs:  - F/u based on pap results -  Assessment & Plan Perimenopausal symptoms Symptoms consistent with perimenopause affecting quality of life. Lab tests  normal. Recommended low-dose estrogen transdermal patch to alleviate symptoms. Informed consent obtained. - Prescribe low-dose estrogen transdermal patch for one tpo three month trial. - Order pelvic ultrasound to rule out pelvic masses. - Instruct to keep symptom diary for heat, sleep, mood, and sweating. - Schedule follow-up in three months to assess treatment efficacy and review ultrasound. - Instruct to send message if symptoms improve significantly before follow-up.  Excessive sweating Increased sweating possibly related to perimenopausal changes. Suggested deodorant alternatives. - Advise trying different deodorant alternatives, including isopropyl alcohol-based hand sanitizers with pleasant scents.   - Well woman exam:   Patient is here for her annual gynecological exam.  We reviewed her menstrual history, contraception options in detail, immunization history, smoking, alcohol and substance abuse risk, depression screening, h/o sexual dysfunction, h/o abuse and current safety at home, dietary assessment, seat belt use.   Diagnoses and all orders for this visit:  Encounter for routine gynecological examination with Papanicolaou smear of cervix -     IGP, Aptima HPV, rfx 16/18,45 - [facility]  Depression screening (Z13.31) -     Depression Screen -(PHQ- 2/9, BDI)  Pelvic pain in female -     US  FDC pelvic transvaginal; Future  Perimenopausal    Return for u/s and follow up . ~~~~~~~~~~~~~~~~~~~~~~~~~~~~~~~~~~~~~~~~~~~~~~~~~~~~~~~~~~  This note was generated in part with voice recognition software and I apologize for any typographical errors that were not detected and corrected.    Attestation Statement:   I personally performed the service. (TP)  BETHANY JOHNATHAN PENTON, MD

## 2023-09-09 DIAGNOSIS — F4312 Post-traumatic stress disorder, chronic: Secondary | ICD-10-CM | POA: Diagnosis not present

## 2023-09-09 DIAGNOSIS — F411 Generalized anxiety disorder: Secondary | ICD-10-CM | POA: Diagnosis not present

## 2023-09-14 DIAGNOSIS — M9901 Segmental and somatic dysfunction of cervical region: Secondary | ICD-10-CM | POA: Diagnosis not present

## 2023-09-14 DIAGNOSIS — M542 Cervicalgia: Secondary | ICD-10-CM | POA: Diagnosis not present

## 2023-09-14 DIAGNOSIS — M9903 Segmental and somatic dysfunction of lumbar region: Secondary | ICD-10-CM | POA: Diagnosis not present

## 2023-09-14 DIAGNOSIS — R519 Headache, unspecified: Secondary | ICD-10-CM | POA: Diagnosis not present

## 2023-10-07 ENCOUNTER — Other Ambulatory Visit: Payer: Self-pay

## 2023-10-07 ENCOUNTER — Emergency Department
Admission: EM | Admit: 2023-10-07 | Discharge: 2023-10-07 | Disposition: A | Discharge status: Home / Self Care | Attending: Emergency Medicine | Admitting: Emergency Medicine

## 2023-10-07 ENCOUNTER — Emergency Department

## 2023-10-07 ENCOUNTER — Encounter: Payer: Self-pay | Admitting: Emergency Medicine

## 2023-10-07 DIAGNOSIS — N23 Unspecified renal colic: Secondary | ICD-10-CM

## 2023-10-07 DIAGNOSIS — R1032 Left lower quadrant pain: Secondary | ICD-10-CM | POA: Diagnosis not present

## 2023-10-07 DIAGNOSIS — N132 Hydronephrosis with renal and ureteral calculous obstruction: Secondary | ICD-10-CM | POA: Insufficient documentation

## 2023-10-07 DIAGNOSIS — N281 Cyst of kidney, acquired: Secondary | ICD-10-CM | POA: Diagnosis not present

## 2023-10-07 DIAGNOSIS — N201 Calculus of ureter: Secondary | ICD-10-CM | POA: Diagnosis not present

## 2023-10-07 DIAGNOSIS — R109 Unspecified abdominal pain: Secondary | ICD-10-CM

## 2023-10-07 DIAGNOSIS — N200 Calculus of kidney: Secondary | ICD-10-CM

## 2023-10-07 LAB — CBC
HCT: 43.3 % (ref 36.0–46.0)
Hemoglobin: 13.7 g/dL (ref 12.0–15.0)
MCH: 28.8 pg (ref 26.0–34.0)
MCHC: 31.6 g/dL (ref 30.0–36.0)
MCV: 91.2 fL (ref 80.0–100.0)
Platelets: 280 K/uL (ref 150–400)
RBC: 4.75 MIL/uL (ref 3.87–5.11)
RDW: 12.1 % (ref 11.5–15.5)
WBC: 13.9 K/uL — ABNORMAL HIGH (ref 4.0–10.5)
nRBC: 0 % (ref 0.0–0.2)

## 2023-10-07 LAB — URINALYSIS, ROUTINE W REFLEX MICROSCOPIC
Bacteria, UA: NONE SEEN
Bilirubin Urine: NEGATIVE
Glucose, UA: NEGATIVE mg/dL
Ketones, ur: 20 mg/dL — AB
Nitrite: NEGATIVE
Protein, ur: 100 mg/dL — AB
RBC / HPF: >50 RBC/hpf (ref 0–5)
Specific Gravity, Urine: 1.028 (ref 1.005–1.030)
pH: 6 (ref 5.0–8.0)

## 2023-10-07 LAB — COMPREHENSIVE METABOLIC PANEL WITH GFR
ALT: 18 U/L (ref 0–44)
AST: 18 U/L (ref 15–41)
Albumin: 4.2 g/dL (ref 3.5–5.0)
Alkaline Phosphatase: 68 U/L (ref 38–126)
Anion gap: 9 (ref 5–15)
BUN: 19 mg/dL (ref 6–20)
CO2: 25 mmol/L (ref 22–32)
Calcium: 8.9 mg/dL (ref 8.9–10.3)
Chloride: 108 mmol/L (ref 98–111)
Creatinine, Ser: 0.95 mg/dL (ref 0.44–1.00)
GFR, Estimated: >60 mL/min (ref >60)
Glucose, Bld: 150 mg/dL — ABNORMAL HIGH (ref 70–99)
Potassium: 3.5 mmol/L (ref 3.5–5.1)
Sodium: 142 mmol/L (ref 135–145)
Total Bilirubin: 0.6 mg/dL (ref 0.0–1.2)
Total Protein: 7.4 g/dL (ref 6.5–8.1)

## 2023-10-07 LAB — PREGNANCY, URINE: Preg Test, Ur: NEGATIVE

## 2023-10-07 LAB — LIPASE, BLOOD: Lipase: 28 U/L (ref 11–51)

## 2023-10-07 MED ORDER — METOCLOPRAMIDE HCL 5 MG/ML IJ SOLN
10.0000 mg | Freq: Once | INTRAMUSCULAR | Status: AC
  Administered 2023-10-07: 10 mg via INTRAVENOUS
  Filled 2023-10-07: qty 2

## 2023-10-07 MED ORDER — MORPHINE SULFATE (PF) 4 MG/ML IV SOLN
4.0000 mg | Freq: Once | INTRAVENOUS | Status: AC
  Administered 2023-10-07: 4 mg via INTRAVENOUS
  Filled 2023-10-07: qty 1

## 2023-10-07 MED ORDER — CEPHALEXIN 500 MG PO CAPS: 500.0000 mg | ORAL_CAPSULE | Freq: Four times a day (QID) | ORAL | 0 refills | Status: DC

## 2023-10-07 MED ORDER — KETOROLAC TROMETHAMINE 15 MG/ML IJ SOLN
15.0000 mg | Freq: Once | INTRAMUSCULAR | Status: AC
  Administered 2023-10-07: 15 mg via INTRAVENOUS
  Filled 2023-10-07: qty 1

## 2023-10-07 MED ORDER — ONDANSETRON HCL 4 MG/2ML IJ SOLN
4.0000 mg | Freq: Once | INTRAMUSCULAR | Status: AC
  Administered 2023-10-07: 4 mg via INTRAVENOUS
  Filled 2023-10-07: qty 2

## 2023-10-07 MED ORDER — IOHEXOL 300 MG/ML  SOLN
100.0000 mL | Freq: Once | INTRAMUSCULAR | Status: AC | PRN
  Administered 2023-10-07: 100 mL via INTRAVENOUS

## 2023-10-07 MED ORDER — SODIUM CHLORIDE 0.9 % IV SOLN
1.0000 g | INTRAVENOUS | Status: DC
  Administered 2023-10-07: 1 g via INTRAVENOUS
  Filled 2023-10-07: qty 10

## 2023-10-07 MED ORDER — ONDANSETRON 4 MG PO TBDP
4.0000 mg | ORAL_TABLET | Freq: Once | ORAL | Status: AC | PRN
  Administered 2023-10-07: 4 mg via ORAL
  Filled 2023-10-07: qty 1

## 2023-10-07 MED ORDER — ONDANSETRON 4 MG PO TBDP
4.0000 mg | ORAL_TABLET | Freq: Three times a day (TID) | ORAL | 0 refills | Status: DC | PRN
Start: 2023-10-07 — End: 2023-10-10

## 2023-10-07 MED ORDER — NAPROXEN 500 MG PO TABS: 500.0000 mg | ORAL_TABLET | Freq: Two times a day (BID) | ORAL | 0 refills | Status: AC

## 2023-10-07 MED ORDER — SODIUM CHLORIDE 0.9 % IV BOLUS
1000.0000 mL | Freq: Once | INTRAVENOUS | Status: AC
  Administered 2023-10-07: 1000 mL via INTRAVENOUS

## 2023-10-07 NOTE — Discharge Instructions (Addendum)
 You were found to have a kidney stone.  This was discussed with the urologist on-call today.  He recommends that we give you antibiotics but he did not wish to place a stent for you today.  However it is recommended that you follow-up very closely with urology, please see them early next week.  Please return to the emergency department immediately

## 2023-10-07 NOTE — ED Triage Notes (Addendum)
 Pt reports that she was awakened with pain at 0300, states that the pain is in her llq and left flank area, pt appears uncomfortable sitting in triage, denies painful urination, reports no hx of kidney stones or diverticulitis, also states that the pain is causing her to vomit. [place]

## 2023-10-07 NOTE — ED Provider Notes (Signed)
 [facility] Provider Note    Event Date/Time   First MD Initiated Contact with Patient 10/07/23 1035     (approximate)   History   Abdominal Pain and Flank Pain   HPI  Mariah [name] is a 43 y.o. female who presents today for evaluation of left-sided flank pain that radiates to her left lower quadrant that has been ongoing since 3 AM this morning.  She reports that it woke her from sleep.  She has had vomiting since began.  She reports mild discomfort with urination.  No diarrhea.  No fevers or chills.  No history of kidney stones.  Patient Active Problem List   Diagnosis Date Noted   Breech presentation, no version 09/25/2018   Supervision of high risk pregnancy in third trimester 09/25/2018   Thrombocytopenia (HCC) 09/03/2018   SIRS (systemic inflammatory response syndrome) (HCC) 09/03/2018   Bacteremia due to Escherichia coli 09/03/2018   Sepsis due to Escherichia coli (HCC) 09/03/2018   Back pain complicating pregnancy 09/01/2018   Urinary tract infection due to extended-spectrum beta lactamase (ESBL) producing Escherichia coli 03/23/2018          Physical Exam   Triage Vital Signs: ED Triage Vitals  Encounter Vitals Group     BP 10/07/23 0800 117/74     Girls Systolic BP Percentile --      Girls Diastolic BP Percentile --      Boys Systolic BP Percentile --      Boys Diastolic BP Percentile --      Pulse Rate 10/07/23 0800 88     Resp 10/07/23 0800 16     Temp 10/07/23 0800 98 F (36.7 C)     Temp Source 10/07/23 0800 Oral     SpO2 10/07/23 0800 100 %     Weight 10/07/23 0801 190 lb (86.2 kg)     Height 10/07/23 0801 5' 10 (1.778 m)     Head Circumference --      Peak Flow --      Pain Score 10/07/23 0801 10     Pain Loc --      Pain Education --      Exclude from Growth Chart --     Most recent vital signs: Vitals:   10/07/23 1142 10/07/23 1234  BP: 129/87   Pulse: 73   Resp: 18   Temp: 98 F (36.7 C) (!) 97.5 F  (36.4 C)  SpO2: 100%     Physical Exam Vitals and nursing note reviewed.  Constitutional:      General: Awake and alert. No acute distress.    Appearance: Normal appearance. The patient is normal weight.  HENT:     Head: Normocephalic and atraumatic.     Mouth: Mucous membranes are moist.  Eyes:     General: PERRL. Normal EOMs        Right eye: No discharge.        Left eye: No discharge.     Conjunctiva/sclera: Conjunctivae normal.  Cardiovascular:     Rate and Rhythm: Normal rate and regular rhythm.     Pulses: Normal pulses.  Pulmonary:     Effort: Pulmonary effort is normal. No respiratory distress.     Breath sounds: Normal breath sounds.  Abdominal:     Abdomen is soft. There is mild left lower quadrant abdominal tenderness. No rebound or guarding. No distention.  Left CVA tenderness Musculoskeletal:        General: No swelling. Normal  range of motion.     Cervical back: Normal range of motion and neck supple.  Skin:    General: Skin is warm and dry.     Capillary Refill: Capillary refill takes less than 2 seconds.     Findings: No rash.  Neurological:     Mental Status: The patient is awake and alert.      ED Results / Procedures / Treatments   Labs (all labs ordered are listed, but only abnormal results are displayed) Labs Reviewed  COMPREHENSIVE METABOLIC PANEL WITH GFR - Abnormal; Notable for the following components:      Result Value   Glucose, Bld 150 (*)    All other components within normal limits  CBC - Abnormal; Notable for the following components:   WBC 13.9 (*)    All other components within normal limits  URINALYSIS, ROUTINE W REFLEX MICROSCOPIC - Abnormal; Notable for the following components:   Color, Urine YELLOW (*)    APPearance CLOUDY (*)    Hgb urine dipstick LARGE (*)    Ketones, ur 20 (*)    Protein, ur 100 (*)    Leukocytes,Ua SMALL (*)    All other components within normal limits  URINE CULTURE  LIPASE, BLOOD  PREGNANCY,  URINE  POC URINE PREG, ED     EKG     RADIOLOGY I independently reviewed and interpreted imaging and agree with radiologists findings.     PROCEDURES:  Critical Care performed:   Procedures   MEDICATIONS ORDERED IN ED: Medications  cefTRIAXone  (ROCEPHIN ) 1 g in sodium chloride  0.9 % 100 mL IVPB (0 g Intravenous Stopped 10/07/23 1527)  ondansetron  (ZOFRAN -ODT) disintegrating tablet 4 mg (4 mg Oral Given 10/07/23 0805)  ketorolac  (TORADOL ) 15 MG/ML injection 15 mg (15 mg Intravenous Given 10/07/23 1128)  sodium chloride  0.9 % bolus 1,000 mL (0 mLs Intravenous Stopped 10/07/23 1403)  ondansetron  (ZOFRAN ) injection 4 mg (4 mg Intravenous Given 10/07/23 1128)  morphine  (PF) 4 MG/ML injection 4 mg (4 mg Intravenous Given 10/07/23 1233)  iohexol  (OMNIPAQUE ) 300 MG/ML solution 100 mL (100 mLs Intravenous Contrast Given 10/07/23 1239)  metoCLOPramide  (REGLAN ) injection 10 mg (10 mg Intravenous Given 10/07/23 1258)     IMPRESSION / MDM / ASSESSMENT AND PLAN / ED COURSE  I reviewed the triage vital signs and the nursing notes.   Differential diagnosis includes, but is not limited to, ureteral colic, nephrolithiasis, UTI, pyelonephritis, infected stone, diverticulitis  Patient is awake and alert, hemodynamically stable and afebrile, though uncomfortable in appearance.  I reviewed the patient's chart.  No recent emergency department visits.  Further workup is indicated.  Labs were obtained in triage.  Patient has a leukocytosis to 13.9, normal creatinine, normal LFTs and lipase.  Urinalysis demonstrates leukocytes without nitrites, no bacteria.  CT scan obtained for further evaluation.  CT with reveals 4 mm calculus in the distal left ureter with mild to moderate left-sided hydroureternephrosis and perinephric stranding.  This was discussed with Dr. Devere, urology on-call to see if he would recommend stenting.  He does not feel that her urine is consistent with an infected stone, and  given the location of the stone in the distal left ureter he recommends a dose of Rocephin  in the ER, discharged home with p.o. antibiotics, and close outpatient follow-up with strict return precautions.  Patient is in agreement with this plan.  Upon reevaluation, she reports that her pain is nearly resolved and it is possible that she has already passed her stone.  She understands very strict return precautions to the ER for signs of infected stone.  She was discharged in stable condition.   Patient's presentation is most consistent with acute presentation with potential threat to life or bodily function.   Clinical Course as of 10/07/23 1706  Fri Oct 07, 2023  1348 Dr. Devere with urology paged [JP]  1353 Discussed with Dr. Devere who does not feel that this is consistent with an infected kidney stone.  However, he does recommend prophylactic antibiotics with Rocephin  and discharged on p.o. Keflex  with close outpatient follow-up.  Also recommend sending urine culture [JP]  1520 Patient reports that pain has resolved [JP]    Clinical Course User Index [JP] Carnella Fryman E, PA-C     FINAL CLINICAL IMPRESSION(S) / ED DIAGNOSES   Final diagnoses:  Ureteral colic  Flank pain  Kidney stone     Rx / DC Orders   ED Discharge Orders          Ordered    cephALEXin  (KEFLEX ) 500 MG capsule  4 times daily        10/07/23 1514    naproxen  (NAPROSYN ) 500 MG tablet  2 times daily with meals        10/07/23 1514    ondansetron  (ZOFRAN -ODT) 4 MG disintegrating tablet  Every 8 hours PRN        10/07/23 1514             Note:  This document was prepared using Dragon voice recognition software and may include unintentional dictation errors.   Maliya Marich E, PA-C 10/07/23 1706    Dorothyann Drivers, MD 10/10/23 2239

## 2023-10-07 NOTE — ED Notes (Signed)
 States her pain is just slightly better Nausea is better

## 2023-10-07 NOTE — ED Notes (Signed)
 See triage note  Presents with left flank pain and moving into abd  States pain woke her up at 3 am  On arrival to room Pacing and slightly tearful

## 2023-10-09 LAB — URINE CULTURE: Culture: NO GROWTH

## 2023-10-10 ENCOUNTER — Ambulatory Visit
Admission: RE | Admit: 2023-10-10 | Discharge: 2023-10-10 | Disposition: A | Discharge status: Home / Self Care | Attending: Physician Assistant | Admitting: Physician Assistant

## 2023-10-10 ENCOUNTER — Ambulatory Visit: Admitting: Physician Assistant

## 2023-10-10 ENCOUNTER — Other Ambulatory Visit: Payer: Self-pay

## 2023-10-10 ENCOUNTER — Telehealth: Payer: Self-pay

## 2023-10-10 ENCOUNTER — Ambulatory Visit
Admission: RE | Admit: 2023-10-10 | Discharge: 2023-10-10 | Disposition: A | Discharge status: Home / Self Care | Source: Ambulatory Visit | Attending: Physician Assistant | Admitting: Physician Assistant

## 2023-10-10 VITALS — BP 119/73 | HR 88 | Ht 70.0 in | Wt 190.0 lb

## 2023-10-10 DIAGNOSIS — N201 Calculus of ureter: Secondary | ICD-10-CM | POA: Diagnosis not present

## 2023-10-10 DIAGNOSIS — N200 Calculus of kidney: Secondary | ICD-10-CM

## 2023-10-10 DIAGNOSIS — N202 Calculus of kidney with calculus of ureter: Secondary | ICD-10-CM | POA: Diagnosis not present

## 2023-10-10 LAB — URINALYSIS, COMPLETE
Bilirubin, UA: NEGATIVE
Glucose, UA: NEGATIVE
Leukocytes,UA: NEGATIVE
Nitrite, UA: NEGATIVE
RBC, UA: NEGATIVE
Specific Gravity, UA: 1.03 (ref 1.005–1.030)
Urobilinogen, Ur: 1 mg/dL (ref 0.2–1.0)
pH, UA: 6 (ref 5.0–7.5)

## 2023-10-10 LAB — MICROSCOPIC EXAMINATION: Epithelial Cells (non renal): >10 /HPF — AB (ref 0–10)

## 2023-10-10 MED ORDER — OXYCODONE-ACETAMINOPHEN 5-325 MG PO TABS: 1.0000 | ORAL_TABLET | ORAL | 0 refills | Status: DC | PRN

## 2023-10-10 MED ORDER — TAMSULOSIN HCL 0.4 MG PO CAPS: 0.4000 mg | ORAL_CAPSULE | Freq: Every day | ORAL | 0 refills | Status: DC

## 2023-10-10 MED ORDER — ONDANSETRON 4 MG PO TBDP: 4.0000 mg | ORAL_TABLET | Freq: Three times a day (TID) | ORAL | 0 refills | Status: AC | PRN

## 2023-10-10 MED ORDER — KETOROLAC TROMETHAMINE 60 MG/2ML IM SOLN
15.0000 mg | Freq: Once | INTRAMUSCULAR | Status: AC
  Administered 2023-10-10: 15 mg via INTRAMUSCULAR

## 2023-10-10 MED ORDER — FLUCONAZOLE 150 MG PO TABS: 150.0000 mg | ORAL_TABLET | Freq: Once | ORAL | 0 refills | Status: AC

## 2023-10-10 MED ORDER — KETOROLAC TROMETHAMINE 60 MG/2ML IM SOLN: 60.0000 mg | Freq: Once | INTRAMUSCULAR | Status: DC

## 2023-10-10 NOTE — Telephone Encounter (Signed)
 Patient called stating she needed an appointment for an ED follow up. I wasn't able to render advice as she is a new patient but I was able to book her for a same day appointment today.

## 2023-10-10 NOTE — Patient Instructions (Addendum)
 For the next 2 weeks, please do the following: -Stay well hydrated -Strain your urine to catch any stones that pass -Take Flomax  0.4mg  daily -Treat any pain with Advil  (ibuprofen ), Naprosyn  (naproxen ), Tylenol  (acetaminophen ), or Percocet. DO NOT TAKE TYLENOL  AND PERCOCET AT THE SAME TIME. YOU MAY ALTERNATE THESE EVERY 6 HOURS INSTEAD. YOU MAY COMBINE PERCOCET AND ADVIL  OR NAPROSYN . -Treat any nausea with Zofran   I will plan to see you back in clinic in 2 weeks with another x-ray prior to see if you have passed your stone.  Please call our office immediately (we are open 8a-5p Monday-Friday) or go to the Emergency Department if you develop any of the following: -Fever/chills -Nausea and/or vomiting uncontrollable with Zofran  -Pain uncontrollable with Percocet

## 2023-10-10 NOTE — Progress Notes (Signed)
 10/10/2023 10:33 AM   [name] Console 05-24-1980 [id number]  CC: Chief Complaint  Patient presents with   Establish Care   New Patient (Initial Visit)   Nephrolithiasis   HPI: Mariah [name] is a 43 y.o. female who presents today as a new patient for ED follow-up of a 4 mm distal left ureteral stone.  She is accompanied today by her aunt.  She was seen in the ED 3 days ago with reports of left flank and LLQ pain and vomiting.  CTAP with contrast showed a 4 mm distal left ureteral stone with mild to moderate left hydroureteronephrosis and perinephric stranding.  She also had bilateral nonobstructing renal stones measuring up to 3 mm.  UA showed >50 RBC/hpf, 21-50 WBC/hpf, and 11-20 squamous epithelial cells/hpf.  Urine culture finalized with no growth.  Today she reports her pain initially resolved after leaving the ED, but returned yesterday.  It is now 5/10 in intensity and she has had new urinary frequency since yesterday.  KUB today with a stable distal left ureteral stone.  In-office UA today positive for 1+ protein and 1+ ketones; urine microscopy with 3-10 WBCs/HPF, >10 epithelial cells/hpf, and moderate bacteria.  PMH: Past Medical History:  Diagnosis Date   Infection    Current UTI January 2020   Urinary tract infection due to extended-spectrum beta lactamase (ESBL) producing Escherichia coli 03/23/2018   Vaginal Pap smear, abnormal    HPV    Surgical History: Past Surgical History:  Procedure Laterality Date   CESAREAN SECTION N/A 09/25/2018   Procedure: CESAREAN SECTION;  Surgeon: Verdon Keen, MD;  Location: [facility] ORS;  Service: Obstetrics;  Laterality: N/A;    Home Medications:  Allergies as of 10/10/2023   No Known Allergies      Medication List        Accurate as of October 10, 2023 10:33 AM. If you have any questions, ask your nurse or doctor.          acetaminophen  325 MG tablet Commonly known as: TYLENOL  Take 650 mg by mouth every 6  (six) hours as needed for moderate pain.   cephALEXin  500 MG capsule Commonly known as: KEFLEX  Take 1 capsule (500 mg total) by mouth 4 (four) times daily for 7 days.   cetirizine 10 MG tablet Commonly known as: ZYRTEC Take 10 mg by mouth daily.   CHILDRENS MULTIVITAMIN PO Take 1 tablet by mouth daily.   ferrous sulfate  325 (65 FE) MG tablet Take 1 tablet (325 mg total) by mouth 2 (two) times daily with a meal.   fluticasone 27.5 MCG/SPRAY nasal spray Commonly known as: VERAMYST Place 2 sprays into the nose daily as needed for allergies.   ibuprofen  800 MG tablet Commonly known as: ADVIL  Take 1 tablet (800 mg total) by mouth every 6 (six) hours.   montelukast  10 MG tablet Commonly known as: SINGULAIR  Take 10 mg by mouth at bedtime.   naproxen  500 MG tablet Commonly known as: Naprosyn  Take 1 tablet (500 mg total) by mouth 2 (two) times daily with a meal for 7 days.   ondansetron  4 MG disintegrating tablet Commonly known as: ZOFRAN -ODT Take 1 tablet (4 mg total) by mouth every 8 (eight) hours as needed.   oxyCODONE  5 MG immediate release tablet Commonly known as: Oxy IR/ROXICODONE  Take 1 tablet (5 mg total) by mouth every 4 (four) hours as needed for moderate pain.        Allergies:  No Known Allergies  Family History:  Family History  Problem Relation Age of Onset   ALS Mother    Emphysema Father    Lumbar disc disease Father    Cancer Paternal Aunt    Arthritis Maternal Grandmother    Heart disease Maternal Grandfather    Osteopenia Paternal Grandmother    Asthma Paternal Grandfather    Glaucoma Paternal Grandfather    Breast cancer Neg Hx     Social History:   reports that she has quit smoking. Her smoking use included cigarettes. She started smoking about 11 years ago. She has a 9.5 pack-year smoking history. She has never used smokeless tobacco. She reports that she does not currently use alcohol. She reports that she does not use drugs.  Physical  Exam: BP 119/73 (BP Location: Left Arm, Patient Position: Sitting, Cuff Size: Normal)   Pulse 88   Ht 5' 10 (1.778 m)   Wt 190 lb (86.2 kg)   SpO2 98%   BMI 27.26 kg/m   Constitutional:  Alert and oriented, uncomfortable appearing, nontoxic appearing HEENT: [place], AT Cardiovascular: No clubbing, cyanosis, or edema Respiratory: Normal respiratory effort, no increased work of breathing Skin: No rashes, bruises or suspicious lesions Neurologic: Grossly intact, no focal deficits, moving all 4 extremities Psychiatric: Normal mood and affect  Laboratory Data: Results for orders placed or performed in visit on 10/10/23  Microscopic Examination   Collection Time: 10/10/23 10:38 AM   Urine  Result Value Ref Range   WBC, UA 0-5 0 - 5 /hpf   RBC, Urine 3-10 (A) 0 - 2 /hpf   Epithelial Cells (non renal) >10 (A) 0 - 10 /hpf   Casts Present (A) None seen /lpf   Cast Type Hyaline casts N/A   Mucus, UA Present (A) Not Estab.   Bacteria, UA Moderate (A) None seen/Few  Urinalysis, Complete   Collection Time: 10/10/23 10:38 AM  Result Value Ref Range   Specific Gravity, UA 1.030 1.005 - 1.030   pH, UA 6.0 5.0 - 7.5   Color, UA Yellow Yellow   Appearance Ur Slightly cloudy Clear   Leukocytes,UA Negative Negative   Protein,UA 1+ (A) Negative/Trace   Glucose, UA Negative Negative   Ketones, UA 1+ (A) Negative   RBC, UA Negative Negative   Bilirubin, UA Negative Negative   Urobilinogen, Ur 1.0 0.2 - 1.0 mg/dL   Nitrite, UA Negative Negative   Microscopic Examination See below:    Pertinent Imaging: KUB, 10/10/2023: See epic  I personally reviewed the images referenced above and note a stable 4 mm distal left ureteral stone.  Assessment & Plan:   1. Left ureteral stone (Primary) Stable, radiopaque 4 mm distal left ureteral stone with moderate pain and bland UA.  We discussed various treatment options for her stone including trial of passage vs. ESWL vs. ureteroscopy with laser  lithotripsy and stent.   We specifically discussed that ESWL is a less invasive procedure that requires less anesthesia, however patients have to pass their residual stone fragments, which may take several weeks and be associated with continued renal colic. Additionally, we discussed the limitations of ESWL including the low, 10-20% chance of treatment failure requiring repeat ESWL versus ureteroscopy in the future. By comparison, ureteroscopy is a more invasive surgical procedure that requires more anesthesia. However, URS does require placement of a ureteral stent, which will remain in place for approximately 3-10 days and can be associated with flank pain, bladder pain, dysuria, urgency, frequency, urinary leakage, and gross hematuria.  Based on this  conversation, she would like to proceed with trial of passage.  We gave her 15 mg IM Toradol  in clinic and I am starting her on Flomax .  I am refilling Percocet and Zofran  for symptom control and we gave her a strainer.  We discussed return precautions including fever, uncontrolled pain, and uncontrolled nausea/vomiting. - Urinalysis, Complete - ondansetron  (ZOFRAN -ODT) 4 MG disintegrating tablet; Take 1 tablet (4 mg total) by mouth every 8 (eight) hours as needed.  Dispense: 10 tablet; Refill: 0 - tamsulosin  (FLOMAX ) 0.4 MG CAPS capsule; Take 1 capsule (0.4 mg total) by mouth daily.  Dispense: 30 capsule; Refill: 0 - fluconazole  (DIFLUCAN ) 150 MG tablet; Take 1 tablet (150 mg total) by mouth once for 1 dose.  Dispense: 1 tablet; Refill: 0 - ketorolac  (TORADOL ) injection 15 mg - oxyCODONE -acetaminophen  (PERCOCET/ROXICET) 5-325 MG tablet; Take 1 tablet by mouth every 4 (four) hours as needed for up to 5 days for severe pain (pain score 7-10).  Dispense: 10 tablet; Refill: 0   Return in about 2 weeks (around 10/24/2023) for Stone f/u with UA + KUB prior.  Lucie Hones, PA-C  [facility] [place] [address] [place], [place] [address] [phone]

## 2023-10-11 ENCOUNTER — Ambulatory Visit: Admitting: Physician Assistant

## 2023-10-14 MED ORDER — OXYCODONE-ACETAMINOPHEN 5-325 MG PO TABS: 1.0000 | ORAL_TABLET | ORAL | 0 refills | Status: AC | PRN

## 2023-10-26 ENCOUNTER — Ambulatory Visit
Admission: RE | Admit: 2023-10-26 | Discharge: 2023-10-26 | Disposition: A | Discharge status: Home / Self Care | Source: Ambulatory Visit | Attending: Physician Assistant | Admitting: Physician Assistant

## 2023-10-26 ENCOUNTER — Other Ambulatory Visit: Payer: Self-pay

## 2023-10-26 ENCOUNTER — Ambulatory Visit: Admitting: Physician Assistant

## 2023-10-26 ENCOUNTER — Ambulatory Visit
Admission: RE | Admit: 2023-10-26 | Discharge: 2023-10-26 | Disposition: A | Discharge status: Home / Self Care | Attending: Physician Assistant | Admitting: Physician Assistant

## 2023-10-26 VITALS — BP 117/76 | HR 78

## 2023-10-26 DIAGNOSIS — N201 Calculus of ureter: Secondary | ICD-10-CM

## 2023-10-26 DIAGNOSIS — N200 Calculus of kidney: Secondary | ICD-10-CM

## 2023-10-26 LAB — URINALYSIS, COMPLETE
Bilirubin, UA: NEGATIVE
Glucose, UA: NEGATIVE
Ketones, UA: NEGATIVE
Leukocytes,UA: NEGATIVE
Nitrite, UA: NEGATIVE
Protein,UA: NEGATIVE
RBC, UA: NEGATIVE
Specific Gravity, UA: 1.02 (ref 1.005–1.030)
Urobilinogen, Ur: 0.2 mg/dL (ref 0.2–1.0)
pH, UA: 6.5 (ref 5.0–7.5)

## 2023-10-26 LAB — MICROSCOPIC EXAMINATION

## 2023-10-26 NOTE — Patient Instructions (Addendum)
 Ultrasound scheduling: [phone]

## 2023-10-26 NOTE — Progress Notes (Signed)
 10/26/2023 10:25 AM   [name] Console [birth year] [id number]  CC: Chief Complaint  Patient presents with   Follow-up   HPI: Mariah [name] is a 43 y.o. female who presents today for follow-up of a 4 mm distal left ureteral stone who elected for trial of passage.   Today she reports her left flank pain abruptly stopped about 9 days ago, though she never saw the stone pass.  She was unable to strain all of her voids.  She is having increased urinary incontinence and wonders if this could be due to Flomax .  KUB today with interval clearance of the distal left ureteral stone.  Notably, this is her first kidney stone episode.  No family history of stones.  On review of CTAP with contrast dated 10/07/2023, she has multiple bilateral nonobstructing renal stones measuring up to 3 mm.  She reports poor hydration and drinking a lot of sweet tea.  In-office UA and microscopy pan negative.  PMH: Past Medical History:  Diagnosis Date   Infection    Current UTI January 2020   Urinary tract infection due to extended-spectrum beta lactamase (ESBL) producing Escherichia coli 03/23/2018   Vaginal Pap smear, abnormal    HPV    Surgical History: Past Surgical History:  Procedure Laterality Date   CESAREAN SECTION N/A 09/25/2018   Procedure: CESAREAN SECTION;  Surgeon: Verdon Keen, MD;  Location: [facility] ORS;  Service: Obstetrics;  Laterality: N/A;    Home Medications:  Allergies as of 10/26/2023   No Known Allergies      Medication List        Accurate as of October 26, 2023 10:25 AM. If you have any questions, ask your nurse or doctor.          acetaminophen  325 MG tablet Commonly known as: TYLENOL  Take 650 mg by mouth every 6 (six) hours as needed for moderate pain.   cetirizine 10 MG tablet Commonly known as: ZYRTEC Take 10 mg by mouth daily.   CHILDRENS MULTIVITAMIN PO Take 1 tablet by mouth daily.   ferrous sulfate  325 (65 FE) MG tablet Take 1 tablet (325 mg  total) by mouth 2 (two) times daily with a meal.   fluticasone 27.5 MCG/SPRAY nasal spray Commonly known as: VERAMYST Place 2 sprays into the nose daily as needed for allergies.   ibuprofen  800 MG tablet Commonly known as: ADVIL  Take 1 tablet (800 mg total) by mouth every 6 (six) hours.   montelukast  10 MG tablet Commonly known as: SINGULAIR  Take 10 mg by mouth at bedtime.   ondansetron  4 MG disintegrating tablet Commonly known as: ZOFRAN -ODT Take 1 tablet (4 mg total) by mouth every 8 (eight) hours as needed.   oxyCODONE  5 MG immediate release tablet Commonly known as: Oxy IR/ROXICODONE  Take 1 tablet (5 mg total) by mouth every 4 (four) hours as needed for moderate pain.   tamsulosin  0.4 MG Caps capsule Commonly known as: FLOMAX  Take 1 capsule (0.4 mg total) by mouth daily.        Allergies:  No Known Allergies  Family History: Family History  Problem Relation Age of Onset   ALS Mother    Emphysema Father    Lumbar disc disease Father    Cancer Paternal Aunt    Arthritis Maternal Grandmother    Heart disease Maternal Grandfather    Osteopenia Paternal Grandmother    Asthma Paternal Grandfather    Glaucoma Paternal Grandfather    Breast cancer Neg Hx  Social History:   reports that she has quit smoking. Her smoking use included cigarettes. She started smoking about 11 years ago. She has a 9.5 pack-year smoking history. She has never used smokeless tobacco. She reports that she does not currently use alcohol. She reports that she does not use drugs.  Physical Exam: BP 117/76 (BP Location: Left Arm, Patient Position: Sitting, Cuff Size: Normal)   Pulse 78   SpO2 99%   Constitutional:  Alert and oriented, no acute distress, nontoxic appearing HEENT: [place], AT Cardiovascular: No clubbing, cyanosis, or edema Respiratory: Normal respiratory effort, no increased work of breathing Skin: No rashes, bruises or suspicious lesions Neurologic: Grossly intact, no focal  deficits, moving all 4 extremities Psychiatric: Normal mood and affect  Laboratory Data: Results for orders placed or performed in visit on 10/26/23  Microscopic Examination   Collection Time: 10/26/23 10:32 AM   Urine  Result Value Ref Range   WBC, UA 0-5 0 - 5 /hpf   RBC, Urine 0-2 0 - 2 /hpf   Epithelial Cells (non renal) 0-10 0 - 10 /hpf   Mucus, UA Present (A) Not Estab.   Bacteria, UA Few None seen/Few  Urinalysis, Complete   Collection Time: 10/26/23 10:32 AM  Result Value Ref Range   Specific Gravity, UA 1.020 1.005 - 1.030   pH, UA 6.5 5.0 - 7.5   Color, UA Yellow Yellow   Appearance Ur Clear Clear   Leukocytes,UA Negative Negative   Protein,UA Negative Negative/Trace   Glucose, UA Negative Negative   Ketones, UA Negative Negative   RBC, UA Negative Negative   Bilirubin, UA Negative Negative   Urobilinogen, Ur 0.2 0.2 - 1.0 mg/dL   Nitrite, UA Negative Negative   Microscopic Examination Comment    Microscopic Examination See below:    Pertinent Imaging: KUB, 10/26/2023: See epic  I personally reviewed the images referenced above and note interval clearance of the distal left ureteral stone.  Assessment & Plan:   1. Left ureteral stone (Primary) UA bland and I do not see her stone anymore on KUB.  Suspect urinary incontinence could be due to Flomax  or possible residual bladder inflammation after stone passage.  Okay to stop Flomax .  Will get follow-up renal ultrasound in about 2 weeks to confirm resolution of hydronephrosis.  If she has persistent hydronephrosis or her pain returns, we will pursue additional treatment options.  We discussed stone prevention recommendations today including increasing p.o. hydration, increasing dietary citrate, decreasing oxalate intake, maintaining moderate calcium  intake, and decreasing sodium intake. - Urinalysis, Complete - US  RENAL; Future   Return for Will call with results.  Lucie Hones, PA-C  [facility]  [place] [address], [address] [place], [place] [address] [phone]

## 2023-11-02 DIAGNOSIS — M9901 Segmental and somatic dysfunction of cervical region: Secondary | ICD-10-CM | POA: Diagnosis not present

## 2023-11-02 DIAGNOSIS — R519 Headache, unspecified: Secondary | ICD-10-CM | POA: Diagnosis not present

## 2023-11-02 DIAGNOSIS — M9903 Segmental and somatic dysfunction of lumbar region: Secondary | ICD-10-CM | POA: Diagnosis not present

## 2023-11-02 DIAGNOSIS — M542 Cervicalgia: Secondary | ICD-10-CM | POA: Diagnosis not present

## 2023-11-04 DIAGNOSIS — F411 Generalized anxiety disorder: Secondary | ICD-10-CM | POA: Diagnosis not present

## 2023-11-04 DIAGNOSIS — F4312 Post-traumatic stress disorder, chronic: Secondary | ICD-10-CM | POA: Diagnosis not present

## 2023-11-08 ENCOUNTER — Ambulatory Visit
Admission: RE | Admit: 2023-11-08 | Discharge: 2023-11-08 | Disposition: A | Discharge status: Home / Self Care | Source: Ambulatory Visit | Attending: Physician Assistant | Admitting: Physician Assistant

## 2023-11-08 DIAGNOSIS — N201 Calculus of ureter: Secondary | ICD-10-CM | POA: Diagnosis not present

## 2023-11-08 DIAGNOSIS — N281 Cyst of kidney, acquired: Secondary | ICD-10-CM | POA: Diagnosis not present

## 2023-11-08 DIAGNOSIS — N1330 Unspecified hydronephrosis: Secondary | ICD-10-CM | POA: Diagnosis not present

## 2023-11-11 ENCOUNTER — Ambulatory Visit: Payer: Self-pay | Admitting: Physician Assistant

## 2023-12-08 DIAGNOSIS — F4312 Post-traumatic stress disorder, chronic: Secondary | ICD-10-CM | POA: Diagnosis not present

## 2023-12-09 DIAGNOSIS — M542 Cervicalgia: Secondary | ICD-10-CM | POA: Diagnosis not present

## 2023-12-09 DIAGNOSIS — M9903 Segmental and somatic dysfunction of lumbar region: Secondary | ICD-10-CM | POA: Diagnosis not present

## 2023-12-09 DIAGNOSIS — M9901 Segmental and somatic dysfunction of cervical region: Secondary | ICD-10-CM | POA: Diagnosis not present

## 2023-12-09 DIAGNOSIS — R519 Headache, unspecified: Secondary | ICD-10-CM | POA: Diagnosis not present

## 2024-01-04 DIAGNOSIS — M9901 Segmental and somatic dysfunction of cervical region: Secondary | ICD-10-CM | POA: Diagnosis not present

## 2024-01-04 DIAGNOSIS — R519 Headache, unspecified: Secondary | ICD-10-CM | POA: Diagnosis not present

## 2024-01-04 DIAGNOSIS — M542 Cervicalgia: Secondary | ICD-10-CM | POA: Diagnosis not present

## 2024-01-04 DIAGNOSIS — M9903 Segmental and somatic dysfunction of lumbar region: Secondary | ICD-10-CM | POA: Diagnosis not present

## 2024-01-06 DIAGNOSIS — F4312 Post-traumatic stress disorder, chronic: Secondary | ICD-10-CM | POA: Diagnosis not present

## 2024-01-06 DIAGNOSIS — F411 Generalized anxiety disorder: Secondary | ICD-10-CM | POA: Diagnosis not present

## 2024-02-08 DIAGNOSIS — M9901 Segmental and somatic dysfunction of cervical region: Secondary | ICD-10-CM | POA: Diagnosis not present

## 2024-02-08 DIAGNOSIS — D2272 Melanocytic nevi of left lower limb, including hip: Secondary | ICD-10-CM | POA: Diagnosis not present

## 2024-02-08 DIAGNOSIS — M9903 Segmental and somatic dysfunction of lumbar region: Secondary | ICD-10-CM | POA: Diagnosis not present

## 2024-02-08 DIAGNOSIS — R519 Headache, unspecified: Secondary | ICD-10-CM | POA: Diagnosis not present

## 2024-02-08 DIAGNOSIS — M542 Cervicalgia: Secondary | ICD-10-CM | POA: Diagnosis not present

## 2024-02-08 DIAGNOSIS — D225 Melanocytic nevi of trunk: Secondary | ICD-10-CM | POA: Diagnosis not present

## 2024-02-08 DIAGNOSIS — D2261 Melanocytic nevi of right upper limb, including shoulder: Secondary | ICD-10-CM | POA: Diagnosis not present

## 2024-02-08 DIAGNOSIS — D2262 Melanocytic nevi of left upper limb, including shoulder: Secondary | ICD-10-CM | POA: Diagnosis not present
